# Patient Record
Sex: Female | Born: 1980 | Hispanic: No | Marital: Married | Smoking: Never smoker
Health system: Southern US, Community
[De-identification: ages and names within clinical notes are randomized; demographics above are authoritative.]

## PROBLEM LIST (undated history)

## (undated) DIAGNOSIS — M5127 Other intervertebral disc displacement, lumbosacral region: Secondary | ICD-10-CM

## (undated) DIAGNOSIS — S161XXA Strain of muscle, fascia and tendon at neck level, initial encounter: Secondary | ICD-10-CM

## (undated) HISTORY — DX: Other intervertebral disc displacement, lumbosacral region: M51.27

## (undated) HISTORY — DX: Strain of muscle, fascia and tendon at neck level, initial encounter: S16.1XXA

---

## 2003-09-21 ENCOUNTER — Ambulatory Visit (HOSPITAL_BASED_OUTPATIENT_CLINIC_OR_DEPARTMENT_OTHER): Admission: RE | Admit: 2003-09-21 | Discharge: 2003-09-21 | Payer: Self-pay | Admitting: Plastic Surgery

## 2003-09-21 ENCOUNTER — Ambulatory Visit (HOSPITAL_COMMUNITY): Admission: RE | Admit: 2003-09-21 | Discharge: 2003-09-21 | Payer: Self-pay | Admitting: Plastic Surgery

## 2003-09-21 ENCOUNTER — Encounter (INDEPENDENT_AMBULATORY_CARE_PROVIDER_SITE_OTHER): Payer: Self-pay | Admitting: Specialist

## 2008-11-12 ENCOUNTER — Inpatient Hospital Stay (HOSPITAL_COMMUNITY): Admission: AD | Admit: 2008-11-12 | Discharge: 2008-11-15 | Payer: Self-pay | Admitting: Obstetrics & Gynecology

## 2008-11-13 ENCOUNTER — Encounter (INDEPENDENT_AMBULATORY_CARE_PROVIDER_SITE_OTHER): Payer: Self-pay | Admitting: Obstetrics and Gynecology

## 2010-03-21 ENCOUNTER — Inpatient Hospital Stay (HOSPITAL_COMMUNITY): Admission: RE | Admit: 2010-03-21 | Discharge: 2010-03-24 | Payer: Self-pay | Admitting: Obstetrics and Gynecology

## 2010-12-10 LAB — CBC
HCT: 27.2 % — ABNORMAL LOW (ref 36.0–46.0)
HCT: 31.5 % — ABNORMAL LOW (ref 36.0–46.0)
Hemoglobin: 10.5 g/dL — ABNORMAL LOW (ref 12.0–15.0)
Hemoglobin: 9.3 g/dL — ABNORMAL LOW (ref 12.0–15.0)
MCH: 26.7 pg (ref 26.0–34.0)
MCH: 27.2 pg (ref 26.0–34.0)
MCHC: 33.3 g/dL (ref 30.0–36.0)
MCHC: 34.1 g/dL (ref 30.0–36.0)
MCV: 79.9 fL (ref 78.0–100.0)
MCV: 80.4 fL (ref 78.0–100.0)
Platelets: 184 10*3/uL (ref 150–400)
Platelets: 210 10*3/uL (ref 150–400)
RBC: 3.41 MIL/uL — ABNORMAL LOW (ref 3.87–5.11)
RBC: 3.91 MIL/uL (ref 3.87–5.11)
RDW: 16.2 % — ABNORMAL HIGH (ref 11.5–15.5)
RDW: 16.9 % — ABNORMAL HIGH (ref 11.5–15.5)
WBC: 7.5 10*3/uL (ref 4.0–10.5)
WBC: 8.1 10*3/uL (ref 4.0–10.5)

## 2010-12-10 LAB — SURGICAL PCR SCREEN
MRSA, PCR: NEGATIVE
Staphylococcus aureus: NEGATIVE

## 2010-12-10 LAB — CCBB MATERNAL DONOR DRAW

## 2010-12-10 LAB — RPR: RPR Ser Ql: NONREACTIVE

## 2011-01-09 LAB — RPR: RPR Ser Ql: NONREACTIVE

## 2011-01-09 LAB — CBC
HCT: 25.3 % — ABNORMAL LOW (ref 36.0–46.0)
HCT: 36.5 % (ref 36.0–46.0)
Hemoglobin: 12.1 g/dL (ref 12.0–15.0)
Hemoglobin: 8.4 g/dL — ABNORMAL LOW (ref 12.0–15.0)
MCHC: 33 g/dL (ref 30.0–36.0)
MCHC: 33.1 g/dL (ref 30.0–36.0)
MCV: 85.3 fL (ref 78.0–100.0)
MCV: 86.5 fL (ref 78.0–100.0)
Platelets: 189 10*3/uL (ref 150–400)
Platelets: 275 10*3/uL (ref 150–400)
RBC: 2.92 MIL/uL — ABNORMAL LOW (ref 3.87–5.11)
RBC: 4.28 MIL/uL (ref 3.87–5.11)
RDW: 14.3 % (ref 11.5–15.5)
RDW: 14.4 % (ref 11.5–15.5)
WBC: 11.6 10*3/uL — ABNORMAL HIGH (ref 4.0–10.5)
WBC: 13 10*3/uL — ABNORMAL HIGH (ref 4.0–10.5)

## 2011-02-06 NOTE — Discharge Summary (Signed)
Cindy Miles, BUMP            ACCOUNT NO.:  [id number]   MEDICAL RECORD NO.:  [id number]          PATIENT TYPE:  INP   LOCATION:  9141                          [facility]ACILITY:  [facility]   PHYSICIAN:  Gerrit Friends. Aldona Bar, M.D.   DATE OF BIRTH:  [birth year]   DATE OF ADMISSION:  11/12/2008  DATE OF DISCHARGE:  11/15/2008                               DISCHARGE SUMMARY   DISCHARGE DIAGNOSES:  1. 38-week intrauterine pregnancy, delivered 7 pounds and 7 ounces,      female infant, Apgars of 9 and 10.  2. Blood type is A positive.  3. Nonreassuring fetal heart tracing.   PROCEDURES:  Primary low transverse cesarean section and repair of left  vaginal extension.   SUMMARY:  This is a 30 year old primigravida with a due date of November 29, 2008 presented with ruptured membranes on November 12, 2008.  She was  admitted and her labor progressed although during the second stage,  developed some late decelerations with pushing, and subsequently was  taken to the operating room for primary low transverse cesarean section  for delivery.  Her postoperative course was relatively benign.  On the  morning of discharge, November 15, 2008, she was afebrile, tolerating a  regular diet well, having normal bowel and bladder function and was  breast feeding without difficulty.  Her wound was clean and dry.  Her  uterus was firm.  Her hemoglobin on November 14, 2008 was 8.4 with a  white count of 11,600, and platelet count of 189,000.  On the morning of  November 15, 2008, she expressed desire to be discharged to home and  accordingly after being given all appropriate instructions, was  discharged to home.  She will return to the office on November 17, 2008  for her wound to be checked and her staples removed and wound Steri-  Stripped.  She was given prescriptions for Motrin 600 mg use every 6  hours as needed for cramping or pain and Tylox 1-2 every 4-6 hours as  needed for more severe pain.  She will continue on her  vitamins as long  as she is breast feeding and she will use Feosol capsules - 1 daily for  the next month or so to correct her anemia.   She understood all her instructions well per discharge brochure and  explanation.  As mentioned, she will return the office on November 17, 2008 to have her wound checked.  Her staples removed and will have her  postpartum checkup in 4 weeks hence.   CONDITION ON DISCHARGE:  Improved.      Gerrit Friends. Aldona Bar, M.D.  Electronically Signed     RMW/MEDQ  D:  11/15/2008  T:  11/15/2008  Job:  [id number]

## 2011-02-06 NOTE — Consult Note (Signed)
Cindy Miles, Cindy Miles            ACCOUNT NO.:  [id number]   MEDICAL RECORD NO.:  [id number]          PATIENT TYPE:  INP   LOCATION:  9141                          [facility]ILITY:  [facility]   PHYSICIAN:  Malva Limes, M.D.    DATE OF BIRTH:  1981/03/07   DATE OF CONSULTATION:  DATE OF DISCHARGE:                                 CONSULTATION   PREOPERATIVE DIAGNOSES:  1. Intrauterine pregnancy at term.  2. Cephalopelvic disproportion.   POSTOPERATIVE DIAGNOSES:  1. Intrauterine pregnancy at term.  2. Cephalopelvic disproportion.   PROCEDURE:  Primary low transverse cesarean section with repair of left  vaginal extension.   SURGEON:  Malva Limes, MD   ANESTHESIA:  Epidural.   ANTIBIOTICS:  Ancef 1 g.   DRAINS:  Foley bedside drainage.   ESTIMATED BLOOD LOSS:  900 mL.   SPECIMENS:  Placenta sent to Pathology.   COMPLICATIONS:  None.   FINDINGS:  The patient delivered 1 live viable white female infant,  weighing 7 pounds 7 ounces.  The Apgars were 9 at 1 minute and 10 at 5  minutes.  The patient had normal fallopian tubes and ovaries  bilaterally.   PROCEDURE:  The patient was taken to the operating room where she was  placed in dorsal supine position with a left lateral tilt.  Once an  adequate level was reached, she was prepped and draped in the usual for  this procedure.  Pfannenstiel skin incision was then made 2-cm above the  pubic symphysis.  This was carried down to the fascia.  The fascia was  entered in the midline and extended laterally with Mayo scissors.  Rectus muscles were then dissected from the fascia with Bovey.  The  rectus muscle was divided in the midline and taken superiorly and  inferiorly.  Bladder peritoneum was entered sharply.  The bladder flap  was taken down sharply.  A low-transverse uterine incision was made in  midline extended laterally with blunt dissection.  The infant was  delivered in the vertex presentation and delivered the head.  The  oropharynx and nostrils were bulb suctioned.  The cord will be clamped  and cut and the infant handed to the awaiting NICU team.  Placenta was  manually removed.  The uterus was exteriorized.  The uterine cavity was  cleaned with wet laps and inspected.  The uterine incision was closed in  a single layer of 0-monocryl running locking fashion.  There was an  extension inferior on the left which was also closed with 0-Monocryl in  running locking fashion.  The bladder flap was then closed using 2-0  Monocryl in running fashion.  Hemostasis was checked and found to be  adequate.  Uterus is placed back in abdominal cavity.  Again, hemostasis  was checked and found to be adequate.  Bladder peritoneum and rectus  muscles reapproximaetd in the midline using 2-0 Monocryl in running  fashion.  Fascia was closed using 0-Monocryl suture in running fashion.  The subcutaneous tissue is made hemostatic with Bovey.  Subcuticular  tissue was closed with interrupted 2-0 plain gut suture.  The skin was  closed with stainless steel clips.  The patient tolerated the procedure  well.  She was taken to the recovery room in stable condition.  Instruments and needle count correct x2.           ______________________________  Malva Limes, M.D.     MA/MEDQ  D:  11/13/2008  T:  11/13/2008  Job:  [id number]

## 2013-07-22 ENCOUNTER — Encounter: Payer: Self-pay | Admitting: Physician Assistant

## 2013-07-22 ENCOUNTER — Ambulatory Visit (INDEPENDENT_AMBULATORY_CARE_PROVIDER_SITE_OTHER): Payer: BC Managed Care – PPO | Admitting: Physician Assistant

## 2013-07-22 VITALS — BP 102/60 | HR 76 | Temp 98.9°F | Resp 18 | Ht 62.5 in | Wt 125.0 lb

## 2013-07-22 DIAGNOSIS — N390 Urinary tract infection, site not specified: Secondary | ICD-10-CM

## 2013-07-22 DIAGNOSIS — R3915 Urgency of urination: Secondary | ICD-10-CM

## 2013-07-22 LAB — URINALYSIS, ROUTINE W REFLEX MICROSCOPIC

## 2013-07-22 LAB — URINALYSIS, MICROSCOPIC ONLY
Casts: NONE SEEN
Crystals: NONE SEEN

## 2013-07-22 MED ORDER — NITROFURANTOIN MONOHYD MACRO 100 MG PO CAPS: 100.0000 mg | ORAL_CAPSULE | Freq: Two times a day (BID) | ORAL | Status: DC

## 2013-07-22 NOTE — Progress Notes (Signed)
Patient ID: Cindy Miles MRN: [id number], DOB: [birth year], 32 y.o. Date of Encounter: 07/22/2013, 3:10 PM    Chief Complaint:  Chief Complaint  Patient presents with  . c/o poss UTI    burning, urgency, pelvic pain  was treatedby nurse at work (who told her to come her)  she gave her one day dose of Bactrim     HPI: 32 y.o. year old white female reports the symptoms started all of a sudden yesterday while at work. She was having some diffuse achy discomfort in her lower abdomen radiating to her lower back. She also developed dysuria and urgency and frequency. 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 They were able to do a urinalysis. Patient was told that it definitely was consistent with infection. They were able to give her one day worth of antibiotics to get her started on treatment with plans for her to followup here. She states she took one day of Bactrim treatment. She's had no fevers or chills.     Home Meds: See attached medication section for any medications that were entered at today's visit. The computer does not put those onto this list.The following list is a list of meds entered prior to today's visit.   No current outpatient prescriptions on file prior to visit.   No current facility-administered medications on file prior to visit.    Allergies: No Known Allergies    Review of Systems: See HPI for pertinent ROS. All other ROS negative.    Physical Exam: Blood pressure 102/60, pulse 76, temperature 98.9 F (37.2 C), temperature source Oral, resp. rate 18, height 5' 2.5" (1.588 m), weight 125 lb (56.7 kg)., Body mass index is 22.48 kg/(m^2). General: WNWD WF.  Appears in no acute distress. Lungs: Clear bilaterally to auscultation without wheezes, rales, or rhonchi. Breathing is unlabored. Heart: Regular rhythm. No murmurs, rubs, or gallops. Abdomen: Soft,  non-distended with normoactive bowel sounds. No hepatomegaly. No rebound/guarding. No  obvious abdominal masses. Mild tenderness with palpation of the suprapubic area. No other areas of tenderness. Msk:  Strength and tone normal for age. No tenderness with percussion of costophrenic angles bilaterally. Extremities/Skin: Warm and dry. No clubbing or cyanosis. No edema. No rashes or suspicious lesions. Neuro: Alert and oriented X 3. Moves all extremities spontaneously. Gait is normal. CNII-XII grossly in tact. Psych:  Responds to questions appropriately with a normal affect.   Results for orders placed in visit on 07/22/13  URINALYSIS, ROUTINE W REFLEX MICROSCOPIC      Result Value Range   Color, Urine ORANGE (*) YELLOW   APPearance CLOUDY (*) CLEAR   Specific Gravity, Urine TEST NOT PERFORMED  1.005 - 1.030   pH TEST NOT PERFORMED  5.0 - 8.0   Glucose, UA TEST NOT PERFORMED  NEG mg/dL   Bilirubin Urine TEST NOT PERFORMED  NEG   Ketones, ur TEST NOT PERFORMED  NEG mg/dL   Hgb urine dipstick TEST NOT PERFORMED  NEG   Protein, ur TEST NOT PERFORMED  NEG mg/dL   Urobilinogen, UA TEST NOT PERFORMED  0.0 - 1.0 mg/dL   Nitrite TEST NOT PERFORMED  NEG   Leukocytes, UA TEST NOT PERFORMED  NEG  URINALYSIS, MICROSCOPIC ONLY      Result Value Range   Squamous Epithelial / LPF FEW  RARE   Crystals NONE SEEN  NONE SEEN   Casts NONE SEEN  NONE SEEN   WBC, UA 7-10 (*) <3 WBC/hpf  RBC / HPF 0-2  <3 RBC/hpf   Bacteria, UA MANY (*) RARE   Urine-Other MUCUS PRESENT       ASSESSMENT AND PLAN:  32 y.o. year old female with  1. UTI (urinary tract infection) - nitrofurantoin, macrocrystal-monohydrate, (MACROBID) 100 MG capsule; Take 1 capsule (100 mg total) by mouth 2 (two) times daily.  Dispense: 6 capsule; Refill: 0  2. Urgency of urination - Urinalysis, Routine w reflex microscopic  Complete all the antibiotic. Follow up if symptoms do not resolve or if develops any fever.  Signed, [address] [place], [place], [facility] 07/22/2013 3:10 PM

## 2014-08-10 ENCOUNTER — Emergency Department (HOSPITAL_COMMUNITY): Payer: BC Managed Care – PPO

## 2014-08-10 ENCOUNTER — Encounter (HOSPITAL_COMMUNITY): Payer: Self-pay | Admitting: *Deleted

## 2014-08-10 ENCOUNTER — Emergency Department (HOSPITAL_COMMUNITY)
Admission: EM | Admit: 2014-08-10 | Discharge: 2014-08-10 | Disposition: A | Discharge status: Home / Self Care | Payer: BC Managed Care – PPO | Attending: Emergency Medicine | Admitting: Emergency Medicine

## 2014-08-10 DIAGNOSIS — Y9389 Activity, other specified: Secondary | ICD-10-CM | POA: Insufficient documentation

## 2014-08-10 DIAGNOSIS — Y92410 Unspecified street and highway as the place of occurrence of the external cause: Secondary | ICD-10-CM | POA: Insufficient documentation

## 2014-08-10 DIAGNOSIS — S3992XA Unspecified injury of lower back, initial encounter: Secondary | ICD-10-CM | POA: Diagnosis not present

## 2014-08-10 DIAGNOSIS — Z79899 Other long term (current) drug therapy: Secondary | ICD-10-CM | POA: Diagnosis not present

## 2014-08-10 DIAGNOSIS — M542 Cervicalgia: Secondary | ICD-10-CM

## 2014-08-10 DIAGNOSIS — Y990 Civilian activity done for income or pay: Secondary | ICD-10-CM | POA: Diagnosis not present

## 2014-08-10 DIAGNOSIS — S199XXA Unspecified injury of neck, initial encounter: Secondary | ICD-10-CM | POA: Insufficient documentation

## 2014-08-10 DIAGNOSIS — M549 Dorsalgia, unspecified: Secondary | ICD-10-CM

## 2014-08-10 DIAGNOSIS — Z791 Long term (current) use of non-steroidal anti-inflammatories (NSAID): Secondary | ICD-10-CM | POA: Insufficient documentation

## 2014-08-10 DIAGNOSIS — M545 Low back pain, unspecified: Secondary | ICD-10-CM

## 2014-08-10 DIAGNOSIS — Z3202 Encounter for pregnancy test, result negative: Secondary | ICD-10-CM | POA: Insufficient documentation

## 2014-08-10 LAB — POC URINE PREG, ED: Preg Test, Ur: NEGATIVE

## 2014-08-10 MED ORDER — MELOXICAM 7.5 MG PO TABS: 7.5000 mg | ORAL_TABLET | Freq: Every day | ORAL | Status: DC

## 2014-08-10 MED ORDER — DIAZEPAM 5 MG PO TABS
5.0000 mg | ORAL_TABLET | Freq: Once | ORAL | Status: AC
  Administered 2014-08-10: 5 mg via ORAL
  Filled 2014-08-10: qty 1

## 2014-08-10 MED ORDER — METHOCARBAMOL 500 MG PO TABS: 500.0000 mg | ORAL_TABLET | Freq: Two times a day (BID) | ORAL | Status: DC

## 2014-08-10 MED ORDER — HYDROCODONE-ACETAMINOPHEN 5-325 MG PO TABS
1.0000 | ORAL_TABLET | ORAL | Status: DC | PRN
Start: 2014-08-10 — End: 2014-08-26

## 2014-08-10 MED ORDER — HYDROCODONE-ACETAMINOPHEN 5-325 MG PO TABS
2.0000 | ORAL_TABLET | Freq: Once | ORAL | Status: AC
  Administered 2014-08-10: 2 via ORAL
  Filled 2014-08-10: qty 2

## 2014-08-10 NOTE — ED Notes (Signed)
Patient transported to radiology Patient in NAD upon leaving unit for testing

## 2014-08-10 NOTE — ED Notes (Signed)
EDP at bedside  

## 2014-08-10 NOTE — ED Notes (Signed)
Patient medicated for pain prior to testing, see MAR

## 2014-08-10 NOTE — ED Notes (Signed)
Patient back from radiology Patient remains in NAD 
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
## 2014-08-10 NOTE — ED Notes (Signed)
Patient ambulatory to bathroom without difficulty or assistance
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
## 2014-08-16 ENCOUNTER — Ambulatory Visit (INDEPENDENT_AMBULATORY_CARE_PROVIDER_SITE_OTHER): Payer: BC Managed Care – PPO | Admitting: Physician Assistant

## 2014-08-16 ENCOUNTER — Encounter: Payer: Self-pay | Admitting: Family Medicine

## 2014-08-16 ENCOUNTER — Encounter: Payer: Self-pay | Admitting: Physician Assistant

## 2014-08-16 DIAGNOSIS — M549 Dorsalgia, unspecified: Secondary | ICD-10-CM

## 2014-08-16 DIAGNOSIS — M542 Cervicalgia: Secondary | ICD-10-CM

## 2014-08-16 MED ORDER — HYDROCODONE-ACETAMINOPHEN 7.5-325 MG PO TABS: 1.0000 | ORAL_TABLET | Freq: Four times a day (QID) | ORAL | Status: DC | PRN
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
## 2014-08-26 ENCOUNTER — Telehealth: Payer: Self-pay | Admitting: *Deleted

## 2014-08-26 DIAGNOSIS — M549 Dorsalgia, unspecified: Secondary | ICD-10-CM

## 2014-08-26 DIAGNOSIS — M542 Cervicalgia: Secondary | ICD-10-CM

## 2014-08-26 MED ORDER — HYDROCODONE-ACETAMINOPHEN 7.5-325 MG PO TABS: 1.0000 | ORAL_TABLET | Freq: Four times a day (QID) | ORAL | Status: DC | PRN

## 2014-08-26 MED ORDER — MELOXICAM 7.5 MG PO TABS: 7.5000 mg | ORAL_TABLET | Freq: Every day | ORAL | Status: DC

## 2014-08-26 MED ORDER — METHOCARBAMOL 500 MG PO TABS: 500.0000 mg | ORAL_TABLET | Freq: Two times a day (BID) | ORAL | Status: DC

## 2014-08-26 NOTE — Telephone Encounter (Signed)
Prescription printed and patient made aware to come to office to pick up.   Prescriptions sent to pharmacy.

## 2014-08-26 NOTE — Telephone Encounter (Signed)
Ok to refill all of these medications--with 0 zero additional refills.
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
## 2014-09-03 ENCOUNTER — Ambulatory Visit (INDEPENDENT_AMBULATORY_CARE_PROVIDER_SITE_OTHER): Payer: BC Managed Care – PPO | Admitting: Family Medicine

## 2014-09-03 ENCOUNTER — Encounter: Payer: Self-pay | Admitting: Family Medicine

## 2014-09-03 DIAGNOSIS — M542 Cervicalgia: Secondary | ICD-10-CM

## 2014-09-03 MED ORDER — PREDNISONE 20 MG PO TABS: ORAL_TABLET | ORAL | Status: DC

## 2014-09-03 MED ORDER — HYDROCODONE-ACETAMINOPHEN 7.5-325 MG PO TABS: 1.0000 | ORAL_TABLET | Freq: Four times a day (QID) | ORAL | Status: DC | PRN

## 2014-09-03 MED ORDER — CARISOPRODOL 350 MG PO TABS: 350.0000 mg | ORAL_TABLET | Freq: Four times a day (QID) | ORAL | Status: DC | PRN
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
## 2014-09-09 ENCOUNTER — Telehealth: Payer: Self-pay | Admitting: Physical Therapy

## 2014-09-09 ENCOUNTER — Ambulatory Visit: Payer: BC Managed Care – PPO | Attending: Family Medicine | Admitting: Physical Therapy

## 2014-09-09 DIAGNOSIS — M542 Cervicalgia: Secondary | ICD-10-CM | POA: Insufficient documentation
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
## 2014-09-09 NOTE — Telephone Encounter (Signed)
Pt stated that she wants to give us a call back to schedule her appts once she speaks with her family.

## 2014-09-09 NOTE — Patient Instructions (Signed)
Extensors, Supine   Lie supine, head on small, rolled towel. Gently tuck chin and bring toward chest. Hold __2_ seconds. Repeat __8_ times per session. Do 3___ sessions per day.  Copyright  VHI. All rights reserved.  Flexibility: Neck Retraction   Pull head straight back, keeping eyes and jaw level. Repeat ___8_ times per set. Do __2__ sets per session. Do ___3_ sessions per day.  http://orth.exer.us/344   Copyright  VHI. All rights reserved.  Posture Tips DO: - stand tall and erect - keep chin tucked in - keep head and shoulders in alignment - check posture regularly in mirror or large window - pull head back against headrest in car seat;  Change your position often.  Sit with lumbar support. DON'T: - slouch or slump while watching TV or reading - sit, stand or lie in one position  for too long;  Sitting is especially hard on the spine so if you sit at a desk/use the computer, then stand up often!   Copyright  VHI. All rights reserved.  Posture - Standing   Good posture is important. Avoid slouching and forward head thrust. Maintain curve in low back and align ears over shoul- ders, hips over ankles.  Pull your belly button in toward your back bone.   Copyright  VHI. All rights reserved.  Posture - Sitting   Sit upright, head facing forward. Try using a roll to support lower back. Keep shoulders relaxed, and avoid rounded back. Keep hips level with knees. Avoid crossing legs for long periods.   Copyright  VHI. All rights reserved.

## 2014-09-13 ENCOUNTER — Ambulatory Visit: Payer: BC Managed Care – PPO | Admitting: Physical Therapy

## 2014-09-14 ENCOUNTER — Ambulatory Visit: Payer: BC Managed Care – PPO | Admitting: Physical Therapy

## 2014-09-14 DIAGNOSIS — M542 Cervicalgia: Secondary | ICD-10-CM | POA: Diagnosis not present

## 2014-09-14 NOTE — Therapy (Signed)
[place], [place], [address] Phone: [phone]   Fax:  [phone]  Physical Therapy Treatment  Patient Details  Name: Cindy Miles MRN: [id number] Date of Birth: [birth year]  Encounter Date: 09/14/2014      PT End of Session - 09/14/14 0959    Visit Number 2   Number of Visits 16   Date for PT Re-Evaluation 11/04/14   PT Start Time 0734   PT Stop Time 0815   PT Time Calculation (min) 41 min   Activity Tolerance Patient tolerated treatment well;Patient limited by pain      No past medical history on file.  No past surgical history on file.  There were no vitals taken for this visit.  Visit Diagnosis:  Neck pain      Subjective Assessment - 09/14/14 0809    Symptoms Feels the same.  Has been doing the neck Range stretches. Has a good chair at work still at the end of he day has 8/10 pain and a migraine type Head ach.                    [facility] Adult PT Treatment/Exercise - 09/14/14 0740    Moist Heat Therapy   Number Minutes Moist Heat 15 Minutes   Moist Heat Location --  back, neck   Electrical Stimulation   Electrical Stimulation Location --  Lt upper trap, neck, scapula   Electrical Stimulation Action IFC   Electrical Stimulation Parameters 8   Electrical Stimulation Goals Pain   Manual Therapy   Manual Therapy --  soft tissue work to upper back, neck, prone. did not change                   PT Short Term Goals - 09/14/14 1004    PT SHORT TERM GOAL #1   Title "Independent with initial HEP   Time 4   Period Weeks   Status Achieved   PT SHORT TERM GOAL #2   Title Cervical sidebending and right rotation  improved to 32 degrees needed for driving, home and work activities.   Time 4   Period Weeks   Status Unable to assess   PT SHORT TERM GOAL #3   Title "Demonstrate understanding of proper sitting posture, body mechanics, work ergonomics, and be more  conscious of position and posture throughout the day   Time 4   Period Weeks   Status On-going           PT Long Term Goals - 09/09/14 1148    PT LONG TERM GOAL #1   Title "Pt will be independent with advanced HEP   Time 8   Period Weeks   Status New   PT LONG TERM GOAL #2   Title Left shoulder flexion and abduction improved to 160 degrees needed for reaching into upper cabinets at home.   Time 8   Period Weeks   Status New   PT LONG TERM GOAL #3   Title "Pt will tolerate working on computer with modifications and breaks as needed with pain 3/10 on average.   Time 8   Period Weeks   Status New   PT LONG TERM GOAL #4   Title "FOTO will improve from  62%  to  36%   indicating improved functional mobility with less pain.   Time 8   Period Weeks   Status New  Plan - 09/14/14 1000    Clinical Impression Statement Pain continues with soft tissue work, helped with modalities.  Adherent with home exercises and tries to use good posture.  No new pain goals met.   PT Next Visit Plan Measure neck, shoulder range Lt. gentle stretches.        Problem List There are no active problems to display for this patient.  Melvenia Needles, PTA 09/14/2014 10:05 AM Phone: [phone] Fax: [phone]  [facility] 09/14/2014, 10:05 AM  [place], [place], [address] Phone: [phone]   Fax:  [phone]

## 2014-09-20 ENCOUNTER — Ambulatory Visit: Payer: BC Managed Care – PPO

## 2014-09-20 DIAGNOSIS — M542 Cervicalgia: Secondary | ICD-10-CM | POA: Diagnosis not present
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
## 2014-09-20 NOTE — Patient Instructions (Signed)
Encouraged pt to gently increase stretch into rotation and side bending  To improve range as it is not improveing

## 2014-09-23 ENCOUNTER — Ambulatory Visit: Payer: BC Managed Care – PPO | Admitting: Rehabilitation

## 2014-09-23 DIAGNOSIS — M542 Cervicalgia: Secondary | ICD-10-CM | POA: Diagnosis not present

## 2014-09-23 NOTE — Patient Instructions (Signed)
Levator Stretch   Grasp seat or sit on hand on side to be stretched. Turn head toward other side and look down. Use hand on head to gently stretch neck in that position. Hold _20-30___ seconds. Repeat on other side. Repeat _3___ times. Do _2___ sessions per day.  http://gt2.exer.us/30   Copyright  VHI. All rights reserved.  Side-Bending   One hand on opposite side of head, pull head to side as far as is comfortable. Stop if there is pain. Hold __30__ seconds. Repeat with other hand to other side. Repeat __3__ times. Do __2__ sessions per day.   Copyright  VHI. All rights reserved.  Scapular Retraction (Standing)   With arms at sides, pinch shoulder blades together. Hold 5 seconds Repeat ___10_ times per set. Do __1-2_ sets per session. Do _2_ sessions per day.  http://orth.exer.us/944   Copyright  VHI. All rights reserved.  Chin Protraction / Retraction   Slide head forward keeping chin level. Slide head back, pulling chin in. Hold each position 5___ seconds. Repeat _10__ times. Do _2__ sessions per day.  Copyright  VHI. All rights reserved.
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
## 2014-09-27 ENCOUNTER — Ambulatory Visit: Payer: BLUE CROSS/BLUE SHIELD | Attending: Family Medicine | Admitting: Physical Therapy

## 2014-09-27 DIAGNOSIS — M542 Cervicalgia: Secondary | ICD-10-CM | POA: Diagnosis not present
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
## 2014-09-27 NOTE — Patient Instructions (Signed)
Discussed the pain cycle.

## 2014-09-30 ENCOUNTER — Encounter: Payer: BC Managed Care – PPO | Admitting: Physical Therapy

## 2014-10-01 ENCOUNTER — Ambulatory Visit: Payer: BLUE CROSS/BLUE SHIELD

## 2014-10-01 ENCOUNTER — Ambulatory Visit: Payer: BC Managed Care – PPO | Admitting: Family Medicine

## 2014-10-01 DIAGNOSIS — M542 Cervicalgia: Secondary | ICD-10-CM | POA: Diagnosis not present
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
## 2014-10-04 ENCOUNTER — Ambulatory Visit: Payer: BLUE CROSS/BLUE SHIELD

## 2014-10-04 ENCOUNTER — Encounter: Payer: Self-pay | Admitting: Family Medicine

## 2014-10-04 ENCOUNTER — Ambulatory Visit (INDEPENDENT_AMBULATORY_CARE_PROVIDER_SITE_OTHER): Payer: BLUE CROSS/BLUE SHIELD | Admitting: Family Medicine

## 2014-10-04 DIAGNOSIS — M542 Cervicalgia: Secondary | ICD-10-CM | POA: Diagnosis not present

## 2014-10-04 MED ORDER — HYDROCODONE-ACETAMINOPHEN 7.5-325 MG PO TABS: 1.0000 | ORAL_TABLET | Freq: Four times a day (QID) | ORAL | Status: DC | PRN
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
## 2014-10-04 NOTE — Patient Instructions (Signed)
She was instructed and handout issued for supine Level 2 cervical stabilization exercises, 10-20 reps , 1x/day at home.

## 2014-10-05 ENCOUNTER — Telehealth: Payer: Self-pay | Admitting: Family Medicine

## 2014-10-05 NOTE — Telephone Encounter (Signed)
[phone] Patient is calling to ask since dr pickard is referring her to pain clinic, does she need to stop going to pt?

## 2014-10-05 NOTE — Telephone Encounter (Signed)
I'd continue pt until after she sees the [facility].

## 2014-10-05 NOTE — Telephone Encounter (Signed)
Pt aware.

## 2014-10-07 ENCOUNTER — Ambulatory Visit: Payer: BLUE CROSS/BLUE SHIELD

## 2014-10-07 ENCOUNTER — Ambulatory Visit: Payer: BC Managed Care – PPO | Admitting: Family Medicine

## 2014-10-12 ENCOUNTER — Ambulatory Visit: Payer: BLUE CROSS/BLUE SHIELD | Admitting: Rehabilitation

## 2014-10-12 DIAGNOSIS — M542 Cervicalgia: Secondary | ICD-10-CM
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
## 2014-11-05 ENCOUNTER — Encounter: Payer: Self-pay | Admitting: Neurology

## 2014-11-05 ENCOUNTER — Ambulatory Visit (INDEPENDENT_AMBULATORY_CARE_PROVIDER_SITE_OTHER): Payer: BLUE CROSS/BLUE SHIELD | Admitting: Neurology

## 2014-11-05 VITALS — BP 102/66 | HR 63 | Ht 64.0 in | Wt 144.4 lb

## 2014-11-05 DIAGNOSIS — S161XXA Strain of muscle, fascia and tendon at neck level, initial encounter: Secondary | ICD-10-CM

## 2014-11-05 HISTORY — DX: Strain of muscle, fascia and tendon at neck level, initial encounter: S16.1XXA

## 2014-11-05 MED ORDER — DICLOFENAC POTASSIUM 50 MG PO TABS: 50.0000 mg | ORAL_TABLET | Freq: Two times a day (BID) | ORAL | Status: DC

## 2014-11-05 MED ORDER — NORTRIPTYLINE HCL 10 MG PO CAPS: ORAL_CAPSULE | ORAL | Status: DC
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
## 2014-11-05 NOTE — Patient Instructions (Signed)
Pamelor (nortriptyline) is an antidepressant medication that has many uses that may include headache, whiplash injuries, or for peripheral neuropathy pain. Side effects may include drowsiness, dry mouth, blurred vision, or constipation. As with any antidepressant medication, worsening depression may occur. If you had any significant side effects, please call our office. The full effects of this medication may take 7-10 days after starting the drug, or going up on the dose.  Cervical Strain and Sprain (Whiplash) with Rehab Cervical strain and sprain are injuries that commonly occur with "whiplash" injuries. Whiplash occurs when the neck is forcefully whipped backward or forward, such as during a motor vehicle accident or during contact sports. The muscles, ligaments, tendons, discs, and nerves of the neck are susceptible to injury when this occurs. RISK FACTORS Risk of having a whiplash injury increases if:  Osteoarthritis of the spine.  Situations that make head or neck accidents or trauma more likely.  High-risk sports (football, rugby, wrestling, hockey, auto racing, gymnastics, diving, contact karate, or boxing).  Poor strength and flexibility of the neck.  Previous neck injury.  Poor tackling technique.  Improperly fitted or padded equipment. SYMPTOMS   Pain or stiffness in the front or back of neck or both.  Symptoms may present immediately or up to 24 hours after injury.  Dizziness, headache, nausea, and vomiting.  Muscle spasm with soreness and stiffness in the neck.  Tenderness and swelling at the injury site. PREVENTION  Learn and use proper technique (avoid tackling with the head, spearing, and head-butting; use proper falling techniques to avoid landing on the head).  Warm up and stretch properly before activity.  Maintain physical fitness:  Strength, flexibility, and endurance.  Cardiovascular fitness.  Wear properly fitted and padded protective equipment,  such as padded soft collars, for participation in contact sports. PROGNOSIS  Recovery from cervical strain and sprain injuries is dependent on the extent of the injury. These injuries are usually curable in 1 week to 3 months with appropriate treatment.  RELATED COMPLICATIONS   Temporary numbness and weakness may occur if the nerve roots are damaged, and this may persist until the nerve has completely healed.  Chronic pain due to frequent recurrence of symptoms.  Prolonged healing, especially if activity is resumed too soon (before complete recovery). TREATMENT  Treatment initially involves the use of ice and medication to help reduce pain and inflammation. It is also important to perform strengthening and stretching exercises and modify activities that worsen symptoms so the injury does not get worse. These exercises may be performed at home or with a therapist. For patients who experience severe symptoms, a soft, padded collar may be recommended to be worn around the neck.  Improving your posture may help reduce symptoms. Posture improvement includes pulling your chin and abdomen in while sitting or standing. If you are sitting, sit in a firm chair with your buttocks against the back of the chair. While sleeping, try replacing your pillow with a small towel rolled to 2 inches in diameter, or use a cervical pillow or soft cervical collar. Poor sleeping positions delay healing.  For patients with nerve root damage, which causes numbness or weakness, the use of a cervical traction apparatus may be recommended. Surgery is rarely necessary for these injuries. However, cervical strain and sprains that are present at birth (congenital) may require surgery. MEDICATION   If pain medication is necessary, nonsteroidal anti-inflammatory medications, such as aspirin and ibuprofen, or other minor pain relievers, such as acetaminophen, are often recommended.  Do not take pain medication for 7 days before  surgery.  Prescription pain relievers may be given if deemed necessary by your caregiver. Use only as directed and only as much as you need. HEAT AND COLD:   Cold treatment (icing) relieves pain and reduces inflammation. Cold treatment should be applied for 10 to 15 minutes every 2 to 3 hours for inflammation and pain and immediately after any activity that aggravates your symptoms. Use ice packs or an ice massage.  Heat treatment may be used prior to performing the stretching and strengthening activities prescribed by your caregiver, physical therapist, or athletic trainer. Use a heat pack or a warm soak. SEEK MEDICAL CARE IF:   Symptoms get worse or do not improve in 2 weeks despite treatment.  New, unexplained symptoms develop (drugs used in treatment may produce side effects). EXERCISES RANGE OF MOTION (ROM) AND STRETCHING EXERCISES - Cervical Strain and Sprain These exercises may help you when beginning to rehabilitate your injury. In order to successfully resolve your symptoms, you must improve your posture. These exercises are designed to help reduce the forward-head and rounded-shoulder posture which contributes to this condition. Your symptoms may resolve with or without further involvement from your physician, physical therapist or athletic trainer. While completing these exercises, remember:   Restoring tissue flexibility helps normal motion to return to the joints. This allows healthier, less painful movement and activity.  An effective stretch should be held for at least 20 seconds, although you may need to begin with shorter hold times for comfort.  A stretch should never be painful. You should only feel a gentle lengthening or release in the stretched tissue. STRETCH- Axial Extensors  Lie on your back on the floor. You may bend your knees for comfort. Place a rolled-up hand towel or dish towel, about 2 inches in diameter, under the part of your head that makes contact with the  floor.  Gently tuck your chin, as if trying to make a "double chin," until you feel a gentle stretch at the base of your head.  Hold __________ seconds. Repeat __________ times. Complete this exercise __________ times per day.  STRETCH - Axial Extension   Stand or sit on a firm surface. Assume a good posture: chest up, shoulders drawn back, abdominal muscles slightly tense, knees unlocked (if standing) and feet hip width apart.  Slowly retract your chin so your head slides back and your chin slightly lowers. Continue to look straight ahead.  You should feel a gentle stretch in the back of your head. Be certain not to feel an aggressive stretch since this can cause headaches later.  Hold for __________ seconds. Repeat __________ times. Complete this exercise __________ times per day. STRETCH - Cervical Side Bend   Stand or sit on a firm surface. Assume a good posture: chest up, shoulders drawn back, abdominal muscles slightly tense, knees unlocked (if standing) and feet hip width apart.  Without letting your nose or shoulders move, slowly tip your right / left ear to your shoulder until your feel a gentle stretch in the muscles on the opposite side of your neck.  Hold __________ seconds. Repeat __________ times. Complete this exercise __________ times per day. STRETCH - Cervical Rotators   Stand or sit on a firm surface. Assume a good posture: chest up, shoulders drawn back, abdominal muscles slightly tense, knees unlocked (if standing) and feet hip width apart.  Keeping your eyes level with the ground, slowly turn your head until you feel  a gentle stretch along the back and opposite side of your neck.  Hold __________ seconds. Repeat __________ times. Complete this exercise __________ times per day. RANGE OF MOTION - Neck Circles   Stand or sit on a firm surface. Assume a good posture: chest up, shoulders drawn back, abdominal muscles slightly tense, knees unlocked (if standing) and  feet hip width apart.  Gently roll your head down and around from the back of one shoulder to the back of the other. The motion should never be forced or painful.  Repeat the motion 10-20 times, or until you feel the neck muscles relax and loosen. Repeat __________ times. Complete the exercise __________ times per day. STRENGTHENING EXERCISES - Cervical Strain and Sprain These exercises may help you when beginning to rehabilitate your injury. They may resolve your symptoms with or without further involvement from your physician, physical therapist, or athletic trainer. While completing these exercises, remember:   Muscles can gain both the endurance and the strength needed for everyday activities through controlled exercises.  Complete these exercises as instructed by your physician, physical therapist, or athletic trainer. Progress the resistance and repetitions only as guided.  You may experience muscle soreness or fatigue, but the pain or discomfort you are trying to eliminate should never worsen during these exercises. If this pain does worsen, stop and make certain you are following the directions exactly. If the pain is still present after adjustments, discontinue the exercise until you can discuss the trouble with your clinician. STRENGTH - Cervical Flexors, Isometric  Face a wall, standing about 6 inches away. Place a small pillow, a ball about 6-8 inches in diameter, or a folded towel between your forehead and the wall.  Slightly tuck your chin and gently push your forehead into the soft object. Push only with mild to moderate intensity, building up tension gradually. Keep your jaw and forehead relaxed.  Hold 10 to 20 seconds. Keep your breathing relaxed.  Release the tension slowly. Relax your neck muscles completely before you start the next repetition. Repeat __________ times. Complete this exercise __________ times per day. STRENGTH- Cervical Lateral Flexors, Isometric   Stand  about 6 inches away from a wall. Place a small pillow, a ball about 6-8 inches in diameter, or a folded towel between the side of your head and the wall.  Slightly tuck your chin and gently tilt your head into the soft object. Push only with mild to moderate intensity, building up tension gradually. Keep your jaw and forehead relaxed.  Hold 10 to 20 seconds. Keep your breathing relaxed.  Release the tension slowly. Relax your neck muscles completely before you start the next repetition. Repeat __________ times. Complete this exercise __________ times per day. STRENGTH - Cervical Extensors, Isometric   Stand about 6 inches away from a wall. Place a small pillow, a ball about 6-8 inches in diameter, or a folded towel between the back of your head and the wall.  Slightly tuck your chin and gently tilt your head back into the soft object. Push only with mild to moderate intensity, building up tension gradually. Keep your jaw and forehead relaxed.  Hold 10 to 20 seconds. Keep your breathing relaxed.  Release the tension slowly. Relax your neck muscles completely before you start the next repetition. Repeat __________ times. Complete this exercise __________ times per day. POSTURE AND BODY MECHANICS CONSIDERATIONS - Cervical Strain and Sprain Keeping correct posture when sitting, standing or completing your activities will reduce the stress put on  different body tissues, allowing injured tissues a chance to heal and limiting painful experiences. The following are general guidelines for improved posture. Your physician or physical therapist will provide you with any instructions specific to your needs. While reading these guidelines, remember:  The exercises prescribed by your provider will help you have the flexibility and strength to maintain correct postures.  The correct posture provides the optimal environment for your joints to work. All of your joints have less wear and tear when properly  supported by a spine with good posture. This means you will experience a healthier, less painful body.  Correct posture must be practiced with all of your activities, especially prolonged sitting and standing. Correct posture is as important when doing repetitive low-stress activities (typing) as it is when doing a single heavy-load activity (lifting). PROLONGED STANDING WHILE SLIGHTLY LEANING FORWARD When completing a task that requires you to lean forward while standing in one place for a long time, place either foot up on a stationary 2- to 4-inch high object to help maintain the best posture. When both feet are on the ground, the low back tends to lose its slight inward curve. If this curve flattens (or becomes too large), then the back and your other joints will experience too much stress, fatigue more quickly, and can cause pain.  RESTING POSITIONS Consider which positions are most painful for you when choosing a resting position. If you have pain with flexion-based activities (sitting, bending, stooping, squatting), choose a position that allows you to rest in a less flexed posture. You would want to avoid curling into a fetal position on your side. If your pain worsens with extension-based activities (prolonged standing, working overhead), avoid resting in an extended position such as sleeping on your stomach. Most people will find more comfort when they rest with their spine in a more neutral position, neither too rounded nor too arched. Lying on a non-sagging bed on your side with a pillow between your knees, or on your back with a pillow under your knees will often provide some relief. Keep in mind, being in any one position for a prolonged period of time, no matter how correct your posture, can still lead to stiffness. WALKING Walk with an upright posture. Your ears, shoulders, and hips should all line up. OFFICE WORK When working at a desk, create an environment that supports good, upright  posture. Without extra support, muscles fatigue and lead to excessive strain on joints and other tissues. CHAIR:  A chair should be able to slide under your desk when your back makes contact with the back of the chair. This allows you to work closely.  The chair's height should allow your eyes to be level with the upper part of your monitor and your hands to be slightly lower than your elbows.  Body position:  Your feet should make contact with the floor. If this is not possible, use a foot rest.  Keep your ears over your shoulders. This will reduce stress on your neck and low back. Document Released: 09/10/2005 Document Revised: 01/25/2014 Document Reviewed: 12/23/2008 [facility] Patient Information 2015 [employer], [place]. This information is not intended to replace advice given to you by your health care provider. Make sure you discuss any questions you have with your health care provider.

## 2014-11-08 ENCOUNTER — Telehealth: Payer: Self-pay | Admitting: Physical Therapy

## 2014-11-08 NOTE — Telephone Encounter (Signed)
Left message on machine requesting d/c per MD request

## 2014-11-09 ENCOUNTER — Encounter: Payer: Self-pay | Admitting: Family Medicine
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
## 2014-11-26 ENCOUNTER — Other Ambulatory Visit: Payer: Self-pay | Admitting: Family Medicine

## 2014-11-26 DIAGNOSIS — M542 Cervicalgia: Secondary | ICD-10-CM

## 2014-11-29 MED ORDER — HYDROCODONE-ACETAMINOPHEN 7.5-325 MG PO TABS: 1.0000 | ORAL_TABLET | Freq: Four times a day (QID) | ORAL | Status: DC | PRN

## 2014-11-29 NOTE — Telephone Encounter (Signed)
RX printed, left up front and patient aware to pick up via mychart 

## 2015-02-14 ENCOUNTER — Encounter: Payer: Self-pay | Admitting: Family Medicine

## 2015-03-21 ENCOUNTER — Other Ambulatory Visit: Payer: Self-pay | Admitting: Family Medicine

## 2015-03-21 DIAGNOSIS — M542 Cervicalgia: Secondary | ICD-10-CM

## 2015-03-21 MED ORDER — HYDROCODONE-ACETAMINOPHEN 7.5-325 MG PO TABS: 1.0000 | ORAL_TABLET | Freq: Four times a day (QID) | ORAL | Status: DC | PRN

## 2015-03-21 NOTE — Telephone Encounter (Signed)
Dr. Jeanice Lim approved refill.   Advised that no further refills can be given without F/U with PCP.   Letter sent.

## 2015-03-22 ENCOUNTER — Encounter: Payer: Self-pay | Admitting: Family Medicine

## 2015-04-21 ENCOUNTER — Encounter: Payer: Self-pay | Admitting: Family Medicine

## 2015-11-17 ENCOUNTER — Emergency Department (HOSPITAL_COMMUNITY): Payer: BLUE CROSS/BLUE SHIELD

## 2015-11-17 ENCOUNTER — Encounter (HOSPITAL_COMMUNITY): Payer: Self-pay | Admitting: Emergency Medicine

## 2015-11-17 ENCOUNTER — Emergency Department (HOSPITAL_COMMUNITY)
Admission: EM | Admit: 2015-11-17 | Discharge: 2015-11-17 | Disposition: A | Discharge status: Home / Self Care | Payer: BLUE CROSS/BLUE SHIELD | Attending: Emergency Medicine | Admitting: Emergency Medicine

## 2015-11-17 DIAGNOSIS — Y92410 Unspecified street and highway as the place of occurrence of the external cause: Secondary | ICD-10-CM | POA: Insufficient documentation

## 2015-11-17 DIAGNOSIS — Z79899 Other long term (current) drug therapy: Secondary | ICD-10-CM | POA: Diagnosis not present

## 2015-11-17 DIAGNOSIS — S8992XA Unspecified injury of left lower leg, initial encounter: Secondary | ICD-10-CM | POA: Insufficient documentation

## 2015-11-17 DIAGNOSIS — Y998 Other external cause status: Secondary | ICD-10-CM | POA: Insufficient documentation

## 2015-11-17 DIAGNOSIS — S199XXA Unspecified injury of neck, initial encounter: Secondary | ICD-10-CM | POA: Diagnosis present

## 2015-11-17 DIAGNOSIS — Y9389 Activity, other specified: Secondary | ICD-10-CM | POA: Insufficient documentation

## 2015-11-17 DIAGNOSIS — T07XXXA Unspecified multiple injuries, initial encounter: Secondary | ICD-10-CM

## 2015-11-17 DIAGNOSIS — T148 Other injury of unspecified body region: Secondary | ICD-10-CM | POA: Insufficient documentation

## 2015-11-17 DIAGNOSIS — M25512 Pain in left shoulder: Secondary | ICD-10-CM | POA: Insufficient documentation

## 2015-11-17 DIAGNOSIS — S161XXA Strain of muscle, fascia and tendon at neck level, initial encounter: Secondary | ICD-10-CM | POA: Diagnosis not present

## 2015-11-17 DIAGNOSIS — S29001A Unspecified injury of muscle and tendon of front wall of thorax, initial encounter: Secondary | ICD-10-CM | POA: Diagnosis not present

## 2015-11-17 MED ORDER — NAPROXEN 375 MG PO TABS: 375.0000 mg | ORAL_TABLET | Freq: Two times a day (BID) | ORAL | Status: DC

## 2015-11-17 MED ORDER — IBUPROFEN 800 MG PO TABS
800.0000 mg | ORAL_TABLET | Freq: Once | ORAL | Status: AC
  Administered 2015-11-17: 800 mg via ORAL
  Filled 2015-11-17: qty 1
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
## 2015-11-17 NOTE — ED Notes (Signed)
Patient transported to X-ray 

## 2015-11-21 ENCOUNTER — Ambulatory Visit (INDEPENDENT_AMBULATORY_CARE_PROVIDER_SITE_OTHER): Payer: BLUE CROSS/BLUE SHIELD | Admitting: Family Medicine

## 2015-11-21 ENCOUNTER — Encounter: Payer: Self-pay | Admitting: Family Medicine

## 2015-11-21 VITALS — BP 100/64 | HR 76 | Temp 98.2°F | Resp 14 | Ht 63.75 in | Wt 142.0 lb

## 2015-11-21 DIAGNOSIS — S134XXA Sprain of ligaments of cervical spine, initial encounter: Secondary | ICD-10-CM

## 2015-11-21 MED ORDER — DIAZEPAM 5 MG PO TABS: 5.0000 mg | ORAL_TABLET | Freq: Three times a day (TID) | ORAL | Status: DC | PRN

## 2015-11-21 MED ORDER — OXYCODONE-ACETAMINOPHEN 5-325 MG PO TABS: 1.0000 | ORAL_TABLET | Freq: Three times a day (TID) | ORAL | Status: DC | PRN

## 2015-11-22 ENCOUNTER — Encounter: Payer: Self-pay | Admitting: Family Medicine
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
## 2015-12-09 ENCOUNTER — Other Ambulatory Visit: Payer: Self-pay | Admitting: Family Medicine

## 2015-12-09 DIAGNOSIS — S134XXA Sprain of ligaments of cervical spine, initial encounter: Secondary | ICD-10-CM

## 2015-12-09 MED ORDER — OXYCODONE-ACETAMINOPHEN 5-325 MG PO TABS: 1.0000 | ORAL_TABLET | Freq: Three times a day (TID) | ORAL | Status: DC | PRN

## 2015-12-09 NOTE — Telephone Encounter (Signed)
Rx ready pt made aware with response to My Chart request.

## 2015-12-09 NOTE — Telephone Encounter (Signed)
LRF 11/21/15 #20   LOV 11/21/15  OK refill?

## 2015-12-09 NOTE — Telephone Encounter (Signed)
ok 

## 2018-01-09 ENCOUNTER — Other Ambulatory Visit: Payer: Self-pay

## 2018-01-09 ENCOUNTER — Ambulatory Visit: Payer: BLUE CROSS/BLUE SHIELD | Admitting: Family Medicine

## 2018-01-09 ENCOUNTER — Encounter: Payer: Self-pay | Admitting: Family Medicine

## 2018-01-09 VITALS — BP 118/64 | HR 86 | Temp 98.3°F | Resp 14 | Ht 62.5 in | Wt 140.0 lb

## 2018-01-09 DIAGNOSIS — J209 Acute bronchitis, unspecified: Secondary | ICD-10-CM | POA: Diagnosis not present

## 2018-01-09 MED ORDER — GUAIFENESIN-CODEINE 100-10 MG/5ML PO SOLN: 5.0000 mL | Freq: Four times a day (QID) | ORAL | 0 refills | Status: DC | PRN

## 2018-01-09 MED ORDER — PREDNISONE 10 MG PO TABS: ORAL_TABLET | ORAL | 0 refills | Status: DC

## 2018-01-09 NOTE — Patient Instructions (Addendum)
Take prednisone  Cough medicine Add anti-histamine as needed F/U as needed

## 2018-01-09 NOTE — Progress Notes (Signed)
   Subjective:    Patient ID: Cindy Miles, female    DOB: [birth year], 37 y.o.   MRN: [id number]  Patient presents for Cough (x1 month- has taken ABTx- still has non-productive cough and chest congestion)  Cough with congestion for past month, treated for sinusitis after having sinus drainage for a couple weeks. Taking robitussin/ claritin D/ Tylenol /Sudafed. Then  Given amoxicllin for 1 week from [employer] at her job, still has not improved.    No asthma history, non smoker  Cough worse at night,does get a rattleing and feels a tightness in chest , taking deep breaths causes couging   Son had the flu a few months ago  No travel outside the country   Review Of Systems:  GEN- denies fatigue, fever, weight loss,weakness, recent illness HEENT- denies eye drainage, change in vision, nasal discharge, CVS- denies chest pain, palpitations RESP- denies SOB, +cough, wheeze ABD- denies N/V, change in stools, abd pain GU- denies dysuria, hematuria, dribbling, incontinence MSK- denies joint pain, muscle aches, injury Neuro- denies headache, dizziness, syncope, seizure activity       Objective:    BP 118/64   Pulse 86   Temp 98.3 F (36.8 C) (Oral)   Resp 14   Ht 5' 2.5" (1.588 m)   Wt 140 lb (63.5 kg)   SpO2 96%   BMI 25.20 kg/m  GEN- NAD, alert and oriented x3 HEENT- PERRL, EOMI, non injected sclera, pink conjunctiva, MMM, oropharynx clear, TM clear bilat, nares clear rhinorrhea  Neck- Supple, no LAD CVS- RRR, no murmur RESP-CTAB, harsh cough EXT- No edema Pulses- Radial, DP- 2+        Assessment & Plan:      Problem List Items Addressed This Visit    None    Visit Diagnoses    Acute bronchitis, unspecified organism    -  Primary   Given prednisone, anti-histamine, robitussin codiene      Note: This dictation was prepared with Dragon dictation along with smaller phrase technology. Any transcriptional errors that result from this process are unintentional.

## 2018-01-13 ENCOUNTER — Other Ambulatory Visit: Payer: Self-pay | Admitting: Family Medicine

## 2019-09-08 ENCOUNTER — Other Ambulatory Visit: Payer: BLUE CROSS/BLUE SHIELD

## 2020-06-30 ENCOUNTER — Encounter: Payer: Self-pay | Admitting: Family Medicine

## 2020-06-30 ENCOUNTER — Telehealth: Payer: Self-pay

## 2020-06-30 ENCOUNTER — Other Ambulatory Visit: Payer: Self-pay | Admitting: Family Medicine

## 2020-06-30 MED ORDER — CYCLOBENZAPRINE HCL 10 MG PO TABS: 10.0000 mg | ORAL_TABLET | Freq: Three times a day (TID) | ORAL | 0 refills | Status: AC | PRN

## 2020-06-30 NOTE — Telephone Encounter (Signed)
Pt sent a my chart result on the MRI order by her Chiropractor and needs to follow up with her Primary Care Dr for Medication.   Pt Call back [phone]

## 2020-06-30 NOTE — Telephone Encounter (Signed)
Please call patient to schedule appointment.

## 2020-06-30 NOTE — Telephone Encounter (Signed)
Pt will make appt in morning

## 2020-07-01 ENCOUNTER — Other Ambulatory Visit: Payer: Self-pay

## 2020-07-01 ENCOUNTER — Other Ambulatory Visit: Payer: Self-pay | Admitting: Family Medicine

## 2020-07-01 ENCOUNTER — Ambulatory Visit: Payer: No Typology Code available for payment source | Admitting: Family Medicine

## 2020-07-01 VITALS — BP 120/80 | HR 79 | Temp 97.9°F | Ht 64.0 in | Wt 144.0 lb

## 2020-07-01 DIAGNOSIS — M5431 Sciatica, right side: Secondary | ICD-10-CM

## 2020-07-01 MED ORDER — OXYCODONE-ACETAMINOPHEN 7.5-325 MG PO TABS: 1.0000 | ORAL_TABLET | ORAL | 0 refills | Status: DC | PRN

## 2020-07-01 MED ORDER — PREDNISONE 20 MG PO TABS: ORAL_TABLET | ORAL | 0 refills | Status: DC

## 2020-07-01 MED ORDER — OXYCODONE-ACETAMINOPHEN 7.5-325 MG PO TABS: 1.0000 | ORAL_TABLET | ORAL | 0 refills | Status: AC | PRN

## 2020-07-01 NOTE — Progress Notes (Signed)
Subjective:    Patient ID: Cindy Miles, female    DOB: 1981-06-24, 39 y.o.   MRN: [id number]  HPI Patient is a very pleasant 39 year old Caucasian female here today complaining of low back pain.  It began a little more than a week ago.  The pain started in the middle of her lower back and began to radiate into her right posterior buttocks and down her right leg into her right foot.  She now states that her right leg feels numb.  She denies any weakness in her right leg.  She has 5/5 equal and symmetric strength with hip flexion and hip extension, knee flexion and knee extension, and dorsi and plantar flexion of the ankles bilaterally.  She has normal 2/4 reflexes at the patella and the Achilles bilaterally.  She has a positive right-sided straight leg raise.  She is unable to sit down.  She has to stand for comfort.  Sitting exacerbates the pain.  She recently had an MRI performed at an outside facility by a chiropractor which showed a herniated disc at L5-S1 with right sided S1 nerve compression.  This is most likely causing her sciatica. Past Medical History:  Diagnosis Date  . Cervical strain 11/05/2014   Past Surgical History:  Procedure Laterality Date  . CESAREAN SECTION  2010 and 2011   Current Outpatient Medications on File Prior to Visit  Medication Sig Dispense Refill  . cyclobenzaprine (FLEXERIL) 10 MG tablet Take 1 tablet (10 mg total) by mouth 3 (three) times daily as needed for muscle spasms. 30 tablet 0  . guaiFENesin-codeine 100-10 MG/5ML syrup Take 5 mLs by mouth every 6 (six) hours as needed. 180 mL 0  . PRESCRIPTION MEDICATION prescription face cream from Dermatologist     No current facility-administered medications on file prior to visit.   No Known Allergies Social History   Socioeconomic History  . Marital status: Married    Spouse name: Not on file  . Number of children: 2  . Years of education: Masters  . Highest education level: Not on file    Occupational History    Employer: [employer]  Tobacco Use  . Smoking status: Never Smoker  . Smokeless tobacco: Never Used  Substance and Sexual Activity  . Alcohol use: No  . Drug use: No  . Sexual activity: Not on file  Other Topics Concern  . Not on file  Social History Narrative   Patient is right handed   Patient drinks one cup caffeine daily.   Social Determinants of Health   Financial Resource Strain:   . Difficulty of Paying Living Expenses: Not on file  Food Insecurity:   . Worried About Programme researcher, broadcasting/film/video in the Last Year: Not on file  . Ran Out of Food in the Last Year: Not on file  Transportation Needs:   . Lack of Transportation (Medical): Not on file  . Lack of Transportation (Non-Medical): Not on file  Physical Activity:   . Days of Exercise per Week: Not on file  . Minutes of Exercise per Session: Not on file  Stress:   . Feeling of Stress : Not on file  Social Connections:   . Frequency of Communication with Friends and Family: Not on file  . Frequency of Social Gatherings with Friends and Family: Not on file  . Attends Religious Services: Not on file  . Active Member of Clubs or Organizations: Not on file  . Attends Banker Meetings: Not  on file  . Marital Status: Not on file  Intimate Partner Violence:   . Fear of Current or Ex-Partner: Not on file  . Emotionally Abused: Not on file  . Physically Abused: Not on file  . Sexually Abused: Not on file      Review of Systems  All other systems reviewed and are negative.      Objective:   Physical Exam Vitals reviewed.  Constitutional:      Appearance: Normal appearance. She is normal weight.  Cardiovascular:     Rate and Rhythm: Normal rate and regular rhythm.     Heart sounds: Normal heart sounds.  Pulmonary:     Effort: Pulmonary effort is normal.     Breath sounds: Normal breath sounds.  Musculoskeletal:     Lumbar back: No signs of trauma, spasms, tenderness or  bony tenderness. Decreased range of motion. Positive right straight leg raise test.       Legs:  Neurological:     General: No focal deficit present.     Mental Status: She is alert and oriented to person, place, and time.     Sensory: Sensory deficit present.     Motor: No weakness.     Coordination: Coordination normal.     Gait: Gait normal.     Deep Tendon Reflexes: Reflexes normal.           Assessment & Plan:  Right sided sciatica  Patient has right-sided sciatica due to herniated disc at L5-S1 causing right-sided S1 nerve root compression.  Begin prednisone taper pack.  Use Percocet every 4-6 hours as needed for severe pain.  Reassess in 1 week.  If no better consider referral to interventional radiology for an epidural steroid injection at L5-S1

## 2020-07-05 ENCOUNTER — Other Ambulatory Visit: Payer: Self-pay | Admitting: Family Medicine

## 2020-07-05 DIAGNOSIS — M5431 Sciatica, right side: Secondary | ICD-10-CM

## 2020-07-06 ENCOUNTER — Other Ambulatory Visit: Payer: Self-pay | Admitting: Family Medicine

## 2020-07-06 DIAGNOSIS — M5431 Sciatica, right side: Secondary | ICD-10-CM

## 2020-07-07 ENCOUNTER — Ambulatory Visit
Admission: RE | Admit: 2020-07-07 | Discharge: 2020-07-07 | Disposition: A | Discharge status: Home / Self Care | Payer: No Typology Code available for payment source | Source: Ambulatory Visit | Attending: Family Medicine | Admitting: Family Medicine

## 2020-07-07 ENCOUNTER — Other Ambulatory Visit: Payer: Self-pay

## 2020-07-07 DIAGNOSIS — M5431 Sciatica, right side: Secondary | ICD-10-CM

## 2020-07-07 MED ORDER — METHYLPREDNISOLONE ACETATE 40 MG/ML INJ SUSP (RADIOLOG
120.0000 mg | Freq: Once | INTRAMUSCULAR | Status: AC
  Administered 2020-07-07: 120 mg via EPIDURAL

## 2020-07-07 MED ORDER — IOPAMIDOL (ISOVUE-M 200) INJECTION 41%
1.0000 mL | Freq: Once | INTRAMUSCULAR | Status: AC
  Administered 2020-07-07: 1 mL via EPIDURAL

## 2020-07-07 NOTE — Discharge Instructions (Signed)

## 2020-07-11 ENCOUNTER — Telehealth: Payer: Self-pay | Admitting: Family Medicine

## 2020-07-11 NOTE — Telephone Encounter (Signed)
Refill Oxycodone 

## 2020-07-12 NOTE — Telephone Encounter (Signed)
Received fax from [facility] and covermymeds that a Prior Authorization is needed for oxycodone acetaminophen 7.5-325 mg Key [id number]

## 2020-07-14 ENCOUNTER — Encounter: Payer: Self-pay | Admitting: Family Medicine

## 2020-07-15 ENCOUNTER — Ambulatory Visit (INDEPENDENT_AMBULATORY_CARE_PROVIDER_SITE_OTHER): Payer: No Typology Code available for payment source | Admitting: Family Medicine

## 2020-07-15 ENCOUNTER — Encounter: Payer: Self-pay | Admitting: Family Medicine

## 2020-07-15 ENCOUNTER — Other Ambulatory Visit: Payer: Self-pay

## 2020-07-15 VITALS — BP 120/80 | HR 66 | Resp 18 | Wt 141.0 lb

## 2020-07-15 DIAGNOSIS — M5431 Sciatica, right side: Secondary | ICD-10-CM | POA: Diagnosis not present

## 2020-07-15 DIAGNOSIS — M5127 Other intervertebral disc displacement, lumbosacral region: Secondary | ICD-10-CM | POA: Insufficient documentation

## 2020-07-15 MED ORDER — OXYCODONE-ACETAMINOPHEN 5-325 MG PO TABS: 1.0000 | ORAL_TABLET | ORAL | 0 refills | Status: AC | PRN

## 2020-07-15 MED ORDER — MELOXICAM 15 MG PO TABS: 15.0000 mg | ORAL_TABLET | Freq: Every day | ORAL | 0 refills | Status: AC

## 2020-07-15 MED ORDER — GABAPENTIN 300 MG PO CAPS: 300.0000 mg | ORAL_CAPSULE | Freq: Three times a day (TID) | ORAL | 3 refills | Status: AC | PRN

## 2020-07-15 NOTE — Telephone Encounter (Signed)
Done today.

## 2020-07-15 NOTE — Progress Notes (Signed)
Subjective:    Patient ID: Cindy Miles, female    DOB: May 27, 1981, 39 y.o.   MRN: [id number]  HPI  07/01/20 Patient is a very pleasant 39 year old Caucasian female here today complaining of low back pain.  It began a little more than a week ago.  The pain started in the middle of her lower back and began to radiate into her right posterior buttocks and down her right leg into her right foot.  She now states that her right leg feels numb.  She denies any weakness in her right leg.  She has 5/5 equal and symmetric strength with hip flexion and hip extension, knee flexion and knee extension, and dorsi and plantar flexion of the ankles bilaterally.  She has normal 2/4 reflexes at the patella and the Achilles bilaterally.  She has a positive right-sided straight leg raise.  She is unable to sit down.  She has to stand for comfort.  Sitting exacerbates the pain.  She recently had an MRI performed at an outside facility by a chiropractor which showed a herniated disc at L5-S1 with right sided S1 nerve compression.  This is most likely causing her sciatica.  At that time, my plan was: Patient has right-sided sciatica due to herniated disc at L5-S1 causing right-sided S1 nerve root compression.  Begin prednisone taper pack.  Use Percocet every 4-6 hours as needed for severe pain.  Reassess in 1 week.  If no better consider referral to interventional radiology for an epidural steroid injection at L5-S1  07/15/20 Had ESI 1 week ago.  Patient saw very little if any improvement.  She is still barely able to sit down.  She stands throughout our entire encounter.  She is miserable.  She reports persistent numbness in her right leg.  She also has burning stinging pain radiating down her right leg and constant low back pain.  Sitting exacerbates the pain.  She denies any bowel or bladder incontinence.  She denies any saddle anesthesia.  She is less than 2 weeks out from an epidural steroid injection and is asking  if she can have another 1. Past Medical History:  Diagnosis Date  . Cervical strain 11/05/2014   Past Surgical History:  Procedure Laterality Date  . CESAREAN SECTION  2010 and 2011   Current Outpatient Medications on File Prior to Visit  Medication Sig Dispense Refill  . cyclobenzaprine (FLEXERIL) 10 MG tablet Take 1 tablet (10 mg total) by mouth 3 (three) times daily as needed for muscle spasms. 30 tablet 0  . guaiFENesin-codeine 100-10 MG/5ML syrup Take 5 mLs by mouth every 6 (six) hours as needed. 180 mL 0  . predniSONE (DELTASONE) 20 MG tablet 3 tabs poqday 1-2, 2 tabs poqday 3-4, 1 tab poqday 5-6 12 tablet 0  . PRESCRIPTION MEDICATION prescription face cream from Dermatologist     No current facility-administered medications on file prior to visit.   No Known Allergies Social History   Socioeconomic History  . Marital status: Married    Spouse name: Not on file  . Number of children: 2  . Years of education: Masters  . Highest education level: Not on file  Occupational History    Employer: [employer]  Tobacco Use  . Smoking status: Never Smoker  . Smokeless tobacco: Never Used  Substance and Sexual Activity  . Alcohol use: No  . Drug use: No  . Sexual activity: Not on file  Other Topics Concern  . Not on file  Social History Narrative   Patient is right handed   Patient drinks one cup caffeine daily.   Social Determinants of Health   Financial Resource Strain:   . Difficulty of Paying Living Expenses: Not on file  Food Insecurity:   . Worried About Programme researcher, broadcasting/film/video in the Last Year: Not on file  . Ran Out of Food in the Last Year: Not on file  Transportation Needs:   . Lack of Transportation (Medical): Not on file  . Lack of Transportation (Non-Medical): Not on file  Physical Activity:   . Days of Exercise per Week: Not on file  . Minutes of Exercise per Session: Not on file  Stress:   . Feeling of Stress : Not on file  Social Connections:   .  Frequency of Communication with Friends and Family: Not on file  . Frequency of Social Gatherings with Friends and Family: Not on file  . Attends Religious Services: Not on file  . Active Member of Clubs or Organizations: Not on file  . Attends Banker Meetings: Not on file  . Marital Status: Not on file  Intimate Partner Violence:   . Fear of Current or Ex-Partner: Not on file  . Emotionally Abused: Not on file  . Physically Abused: Not on file  . Sexually Abused: Not on file      Review of Systems  All other systems reviewed and are negative.      Objective:   Physical Exam Vitals reviewed.  Constitutional:      Appearance: Normal appearance. She is normal weight.  Cardiovascular:     Rate and Rhythm: Normal rate and regular rhythm.     Heart sounds: Normal heart sounds.  Pulmonary:     Effort: Pulmonary effort is normal.     Breath sounds: Normal breath sounds.  Musculoskeletal:     Lumbar back: No signs of trauma, spasms, tenderness or bony tenderness. Decreased range of motion. Positive right straight leg raise test.       Legs:  Neurological:     General: No focal deficit present.     Mental Status: She is alert and oriented to person, place, and time.     Sensory: Sensory deficit present.     Motor: No weakness.     Coordination: Coordination normal.     Gait: Gait normal.     Deep Tendon Reflexes: Reflexes normal.           Assessment & Plan:  Right sided sciatica  Patient has numbness in her right leg now for 3 weeks and worsening pain despite trying oral prednisone as well as an epidural steroid injection at L5-S1.  At this point I believe she would benefit from a neurosurgical consultation prior to receiving a second epidural steroid injection.  Meanwhile I will start the patient on gabapentin 300 mg 3 times a day along with meloxicam 15 mg a day.  I did give the patient Percocet 5/325 1 p.o. every 6 hours as needed pain.  I will give her  90 tablets which should be sufficient to last 1 to 2 months because I do believe this is likely going to be a long-term issue over the next 6 weeks.  I will try to have this approved by her insurance which denied by previous prescription

## 2020-07-16 NOTE — Telephone Encounter (Signed)
Received fax back stating this has been approved valid from 07/15/20-07/15/21 as long as patient continues with her current benefit plan.

## 2020-07-21 ENCOUNTER — Encounter: Payer: Self-pay | Admitting: Neurology

## 2020-07-21 ENCOUNTER — Other Ambulatory Visit: Payer: Self-pay

## 2020-07-21 DIAGNOSIS — R202 Paresthesia of skin: Secondary | ICD-10-CM

## 2020-07-25 HISTORY — PX: MICRODISCECTOMY LUMBAR: SUR864

## 2020-07-26 ENCOUNTER — Ambulatory Visit: Payer: No Typology Code available for payment source | Admitting: Neurology

## 2020-07-26 ENCOUNTER — Other Ambulatory Visit: Payer: Self-pay

## 2020-07-26 DIAGNOSIS — R202 Paresthesia of skin: Secondary | ICD-10-CM | POA: Diagnosis not present

## 2020-07-26 NOTE — Procedures (Signed)
[facility]  [address] [place], [address]  [place], [place] [address] Tel: [phone] Fax:  [phone] Test Date:  07/26/2020  Patient: Cindy Miles DOB: May 01, 1981 Physician: Nita Sickle, DO  Sex: Female Height: 5\' 4"  Ref Phys:  ID#: Monica Martinez   Technician:    Patient Complaints: This is a 39 year old female referred for evaluation of bilateral hand paresthesias.  NCV & EMG Findings: Extensive electrodiagnostic testing of the right upper extremity and additional studies of the left shows:  1. Bilateral median, ulnar, and mixed palmar sensory responses are within normal limits. 2. Bilateral median and ulnar motor responses are within normal limits. 3. There is no evidence of active or chronic motor axonal loss changes affecting any of the tested muscles.  Motor unit configuration and recruitment pattern is within normal limits.   Impression: This is a normal study of the upper extremities.  In particular, there is no evidence of carpal tunnel syndrome or a cervical radiculopathy.   ___________________________ 24, DO    Nerve Conduction Studies Anti Sensory Summary Table   Stim Site NR Peak (ms) Norm Peak (ms) P-T Amp (V) Norm P-T Amp  Left Median Anti Sensory (2nd Digit)  32C  Wrist    2.9 <3.4 72.3 >20  Right Median Anti Sensory (2nd Digit)  32C  Wrist    3.1 <3.4 78.2 >20  Left Ulnar Anti Sensory (5th Digit)  32C  Wrist    2.7 <3.1 77.8 >12  Right Ulnar Anti Sensory (5th Digit)  32C  Wrist    2.6 <3.1 76.4 >12   Motor Summary Table   Stim Site NR Onset (ms) Norm Onset (ms) O-P Amp (mV) Norm O-P Amp Site1 Site2 Delta-0 (ms) Dist (cm) Vel (m/s) Norm Vel (m/s)  Left Median Motor (Abd Poll Brev)  32C  Wrist    2.9 <3.9 10.9 >6 Elbow Wrist 4.2 25.0 60 >50  Elbow    7.1  10.4         Right Median Motor (Abd Poll Brev)  32C  Wrist    3.0 <3.9 10.0 >6 Elbow Wrist 4.0 25.0 63 >50  Elbow    7.0  10.0         Left Ulnar  Motor (Abd Dig Minimi)  32C  Wrist    2.3 <3.1 14.0 >7 B Elbow Wrist 3.3 21.0 64 >50  B Elbow    5.6  13.6  A Elbow B Elbow 1.6 10.0 62 >50  A Elbow    7.2  13.6         Right Ulnar Motor (Abd Dig Minimi)  32C  Wrist    2.3 <3.1 13.1 >7 B Elbow Wrist 3.3 21.0 64 >50  B Elbow    5.6  12.7  A Elbow B Elbow 1.6 10.0 62 >50  A Elbow    7.2  12.7          Comparison Summary Table   Stim Site NR Peak (ms) Norm Peak (ms) P-T Amp (V) Site1 Site2 Delta-P (ms) Norm Delta (ms)  Left Median/Ulnar Palm Comparison (Wrist - 8cm)  32C  Median Palm    1.5 <2.2 99.0 Median Palm Ulnar Palm 0.2   Ulnar Palm    1.3 <2.2 38.0      Right Median/Ulnar Palm Comparison (Wrist - 8cm)  32C  Median Palm    1.7 <2.2 83.9 Median Palm Ulnar Palm 0.0   Ulnar Palm    1.7 <2.2 20.7  EMG   Side Muscle Ins Act Fibs Psw Fasc Number Recrt Dur Dur. Amp Amp. Poly Poly. Comment  Right 1stDorInt Nml Nml Nml Nml Nml Nml Nml Nml Nml Nml Nml Nml N/A  Right PronatorTeres Nml Nml Nml Nml Nml Nml Nml Nml Nml Nml Nml Nml N/A  Right Biceps Nml Nml Nml Nml Nml Nml Nml Nml Nml Nml Nml Nml N/A  Right Triceps Nml Nml Nml Nml Nml Nml Nml Nml Nml Nml Nml Nml N/A  Right Deltoid Nml Nml Nml Nml Nml Nml Nml Nml Nml Nml Nml Nml N/A  Left 1stDorInt Nml Nml Nml Nml Nml Nml Nml Nml Nml Nml Nml Nml N/A  Left PronatorTeres Nml Nml Nml Nml Nml Nml Nml Nml Nml Nml Nml Nml N/A  Left Biceps Nml Nml Nml Nml Nml Nml Nml Nml Nml Nml Nml Nml N/A  Left Triceps Nml Nml Nml Nml Nml Nml Nml Nml Nml Nml Nml Nml N/A  Left Deltoid Nml Nml Nml Nml Nml Nml Nml Nml Nml Nml Nml Nml N/A      Waveforms:

## 2020-09-06 ENCOUNTER — Encounter: Payer: No Typology Code available for payment source | Admitting: Neurology

## 2021-06-27 IMAGING — XA Imaging study
2 series · 2 of 2 positions shown · non-contrast
Comparison: none

CLINICAL DATA: Right posterolateral disc herniation L5-S1 with
right radicular pain. Spondylosis without myelopathy.

[Series 1: ortho standard · 1 of 1 slices shown (1 of 2)]
[im 1/1]
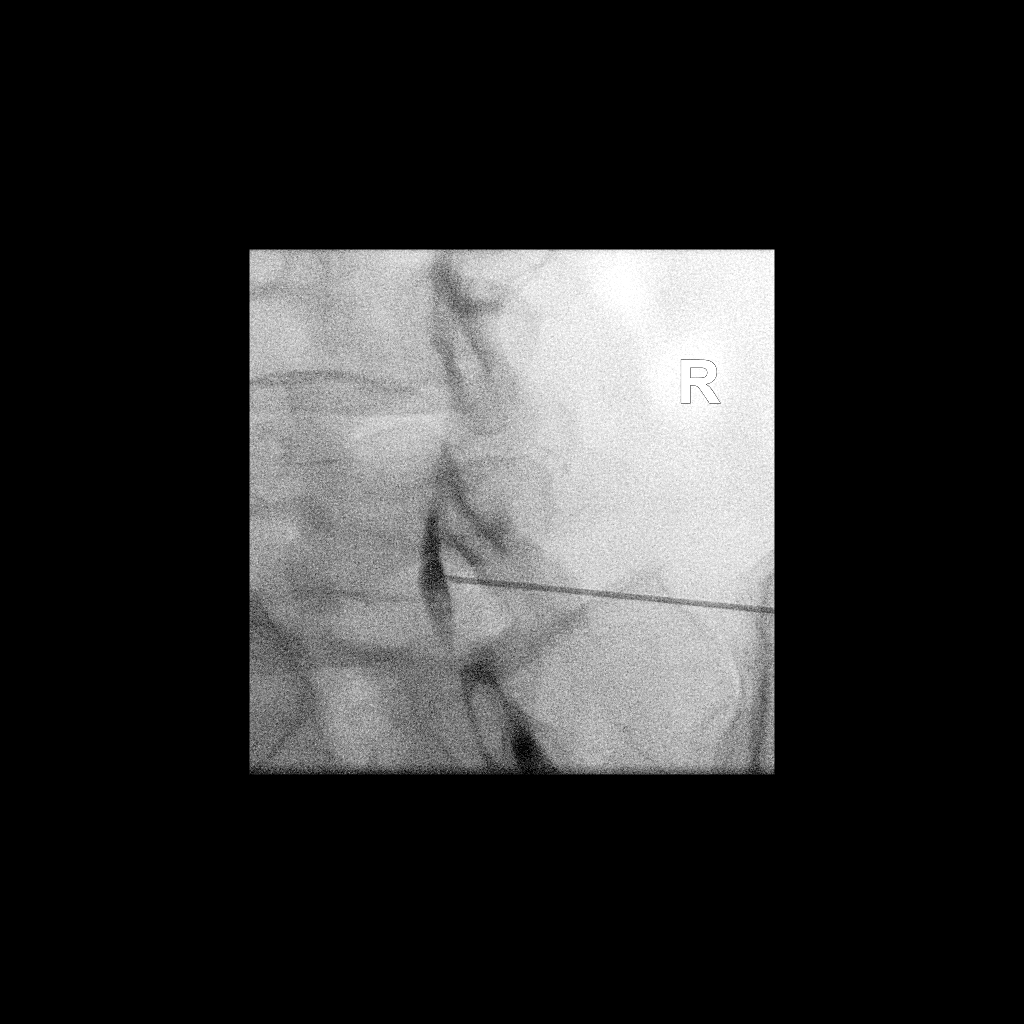

[Series 2: ortho standard · 1 of 1 slices shown (2 of 2)]
[im 1/1]
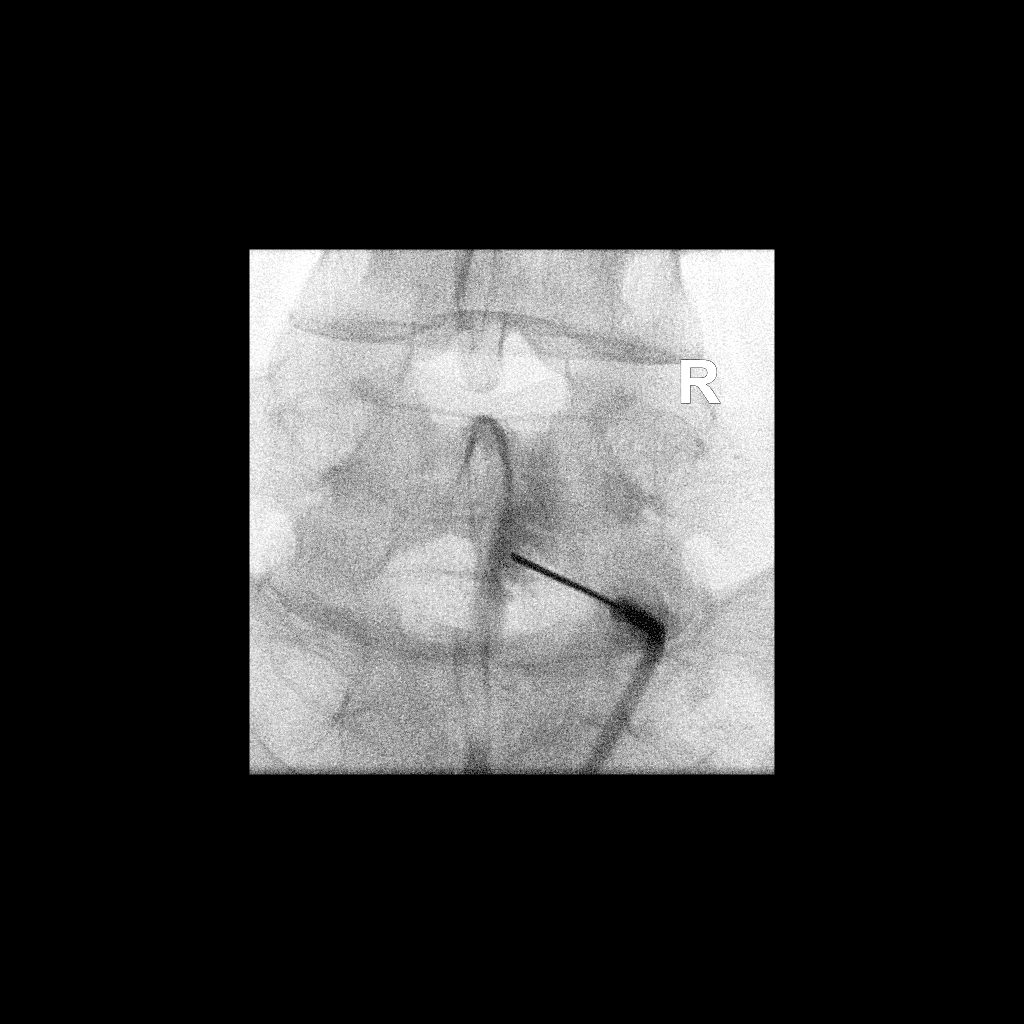

[2 of 2 positions shown; findings below may reference images not displayed]

FLUOROSCOPY TIME:  0 minutes 20 seconds. 7.96 micro gray meter
squared

PROCEDURE:
The procedure, risks, benefits, and alternatives were explained to
the patient. Questions regarding the procedure were encouraged and
answered. The patient understands and consents to the procedure.

LUMBAR EPIDURAL INJECTION:

An interlaminar approach was performed on the right at L5-S1. The
overlying skin was cleansed and anesthetized. A 20 gauge epidural
needle was advanced using loss-of-resistance technique.

DIAGNOSTIC EPIDURAL INJECTION:

Injection of Isovue-M 200 shows a good epidural pattern with spread
above and below the level of needle placement, primarily on the
right. No vascular opacification is seen.

THERAPEUTIC EPIDURAL INJECTION:

One hundred twenty mg of Depo-Medrol mixed with 2 cc 1% lidocaine
were instilled. The procedure was well-tolerated, and the patient
was discharged thirty minutes following the injection in good
condition.

COMPLICATIONS:
None
IMPRESSION: Technically successful epidural injection on the right L5-S1 # 1.

## 2021-10-30 ENCOUNTER — Emergency Department (HOSPITAL_COMMUNITY)
Admission: EM | Admit: 2021-10-30 | Discharge: 2021-10-30 | Discharge status: Against Medical Advice | Payer: No Typology Code available for payment source | Source: Home / Self Care

## 2021-10-30 ENCOUNTER — Ambulatory Visit: Admit: 2021-10-30 | Discharge status: Absent without leave | Payer: No Typology Code available for payment source

## 2022-01-22 ENCOUNTER — Other Ambulatory Visit: Payer: Self-pay | Admitting: Obstetrics and Gynecology

## 2022-01-22 DIAGNOSIS — Z803 Family history of malignant neoplasm of breast: Secondary | ICD-10-CM

## 2022-02-15 ENCOUNTER — Encounter: Payer: Self-pay | Admitting: Gastroenterology

## 2022-02-15 ENCOUNTER — Ambulatory Visit: Payer: No Typology Code available for payment source | Admitting: Gastroenterology

## 2022-02-15 VITALS — BP 110/70 | HR 72 | Resp 14 | Ht 64.0 in | Wt 142.8 lb

## 2022-02-15 DIAGNOSIS — Z800 Family history of malignant neoplasm of digestive organs: Secondary | ICD-10-CM

## 2022-02-15 MED ORDER — SUTAB 1479-225-188 MG PO TABS: 1.0000 | ORAL_TABLET | Freq: Once | ORAL | 0 refills | Status: AC

## 2022-02-15 NOTE — Progress Notes (Signed)
HPI : Cindy Miles is a very pleasant 41 year old female with no significant chronic medical history who is referred to [place] by Dr. Lynnea Ferrier for early colon cancer screening due to family history of colon cancer.  The patient's father was diagnosed with colon cancer at age 20.  Reportedly his father was also diagnosed with colon cancer at an unknown age.  Patient also has family history of uterine cancer and underwent genetic testing as recommended by her gynecologist.  Genetic testing was negative for any mutations. The patient denies any chronic GI symptoms.  Specifically, she denies issues with abdominal pain, constipation, diarrhea or blood in the stool.  She also denies any upper GI symptoms to include heartburn, acid regurgitation, nausea/vomiting or dysphagia.  Her weight has been stable.  She has no known cardiopulmonary comorbidities.   Past Medical History:  Diagnosis Date   Cervical strain 11/05/2014   Herniated nucleus pulposus, L5-S1     Past Surgical History:  Procedure Laterality Date   CESAREAN SECTION  2010 and 2011   MICRODISCECTOMY LUMBAR  07/25/2020  Hemorrhoidectomy 2013  Family History  Problem Relation Age of Onset   Healthy Mother    Colon cancer Father    Healthy Father    Healthy Brother    Uterine cancer Maternal Grandmother    Liver cancer Maternal Grandmother    Colon cancer Paternal Grandfather    Esophageal cancer Neg Hx    Pancreatic cancer Neg Hx    Stomach cancer Neg Hx    Social History   Tobacco Use   Smoking status: Never   Smokeless tobacco: Never  Substance Use Topics   Alcohol use: No   Drug use: No   Current Outpatient Medications  Medication Sig Dispense Refill   cyclobenzaprine (FLEXERIL) 10 MG tablet Take 1 tablet (10 mg total) by mouth 3 (three) times daily as needed for muscle spasms. 30 tablet 0   gabapentin (NEURONTIN) 300 MG capsule Take 1 capsule (300 mg total) by mouth 3 (three) times daily as needed (nerve  pain). 90 capsule 3   meloxicam (MOBIC) 15 MG tablet Take 1 tablet (15 mg total) by mouth daily. 30 tablet 0   PRESCRIPTION MEDICATION prescription face cream from Dermatologist     No current facility-administered medications for this visit.   No Known Allergies   Review of Systems: All systems reviewed and negative except where noted in HPI.    No results found.  Physical Exam: BP 110/70 (BP Location: Right Arm, Patient Position: Sitting, Cuff Size: Normal)   Pulse 72   Resp 14   Ht 5\' 4"  (1.626 m)   Wt 142 lb 12.8 oz (64.8 kg)   LMP 01/27/2022   SpO2 97%   BMI 24.51 kg/m  Constitutional: Pleasant,well-developed, Caucasian female in no acute distress. HEENT: Normocephalic and atraumatic. Conjunctivae are normal. No scleral icterus. Neck supple.  Cardiovascular: Normal rate, regular rhythm.  Pulmonary/chest: Effort normal and breath sounds normal. No wheezing, rales or rhonchi. Abdominal: Soft, nondistended, nontender. Bowel sounds active throughout. There are no masses palpable. No hepatomegaly. Extremities: no edema Neurological: Alert and oriented to person place and time. Skin: Skin is warm and dry. No rashes noted. Psychiatric: Normal mood and affect. Behavior is normal.  CBC    Component Value Date/Time   WBC 8.1 03/22/2010 0520   RBC 3.41 (L) 03/22/2010 0520   HGB 9.3 (L) 03/22/2010 0520   HCT 27.2 (L) 03/22/2010 0520   PLT 184 03/22/2010 0520  MCV 79.9 03/22/2010 0520   MCH 27.2 03/22/2010 0520   MCHC 34.1 03/22/2010 0520   RDW 16.9 (H) 03/22/2010 0520    CMP  No results found for: NA, K, CL, CO2, GLUCOSE, BUN, CREATININE, CALCIUM, PROT, ALBUMIN, AST, ALT, ALKPHOS, BILITOT, GFRNONAA, GFRAA   ASSESSMENT AND PLAN: 41 year old female with first-degree relative diagnosed colon cancer for the age of 76 (father, 74) overdue for initial high risk screening colonoscopy.  She has no chronic GI symptoms.  Patient will need a colonoscopy every 5 years per  guidelines based on his family history.  If the patient has a large polyp or numerous polyps, she may need colonoscopy sooner than 5 years.  Family history of colon cancer -Colonoscopy  The details, risks (including bleeding, perforation, infection, missed lesions, medication reactions and possible hospitalization or surgery if complications occur), benefits, and alternatives to colonoscopy with possible biopsy and possible polypectomy were discussed with the patient and she consents to proceed.   Mitra Duling E. Tomasa Rand, MD [facility]   Pickard, Priscille Heidelberg, MD

## 2022-02-15 NOTE — Patient Instructions (Signed)
If you are age 41 or older, your body mass index should be between 23-30. Your Body mass index is 24.51 kg/m?. If this is out of the aforementioned range listed, please consider follow up with your Primary Care Provider. ? ?If you are age 64 or younger, your body mass index should be between 19-25. Your Body mass index is 24.51 kg/m?. If this is out of the aformentioned range listed, please consider follow up with your Primary Care Provider.  ? ?You have been scheduled for a colonoscopy. Please follow written instructions given to you at your visit today.  ?Please pick up your prep supplies at the pharmacy within the next 1-3 days. ?If you use inhalers (even only as needed), please bring them with you on the day of your procedure. ? ? ?The [facility] providers would like to encourage you to use MYCHART to communicate with providers for non-urgent requests or questions.  Due to long hold times on the telephone, sending your provider a message by MYCHART may be a faster and more efficient way to get a response.  Please allow 48 business hours for a response.  Please remember that this is for non-urgent requests.  ? ?It was a pleasure to see you today! ? ?Thank you for trusting me with your gastrointestinal care!   ? ?Scott E.Cunningham, MD  ? ?

## 2022-02-22 ENCOUNTER — Telehealth: Payer: Self-pay | Admitting: Gastroenterology

## 2022-02-22 NOTE — Telephone Encounter (Signed)
Patient called and said when she was here for her appointment you said to call and have you check to see if you had any tablet prep samples she could get before she goes to her pharmacy and has to purchase them.  Please call patient and advise.  Thank you.

## 2022-02-22 NOTE — Telephone Encounter (Signed)
Returned patients call. Patient stated that she could come by tomorrow morning to pick up a prep kit.

## 2022-04-19 ENCOUNTER — Encounter: Payer: Self-pay | Admitting: Gastroenterology

## 2022-04-19 ENCOUNTER — Ambulatory Visit (AMBULATORY_SURGERY_CENTER): Payer: No Typology Code available for payment source | Admitting: Gastroenterology

## 2022-04-19 VITALS — BP 95/51 | HR 65 | Temp 97.7°F | Resp 14 | Ht 64.0 in | Wt 142.0 lb

## 2022-04-19 DIAGNOSIS — Z800 Family history of malignant neoplasm of digestive organs: Secondary | ICD-10-CM | POA: Diagnosis not present

## 2022-04-19 DIAGNOSIS — Z1211 Encounter for screening for malignant neoplasm of colon: Secondary | ICD-10-CM | POA: Diagnosis present

## 2022-04-19 MED ORDER — SODIUM CHLORIDE 0.9 % IV SOLN: 500.0000 mL | Freq: Once | INTRAVENOUS | Status: DC

## 2022-04-19 NOTE — Patient Instructions (Signed)
Resume previous diet.  Continue present medications.  Repeat colonoscopy in 5 years for screening purposes.  YOU HAD AN ENDOSCOPIC PROCEDURE TODAY AT THE [facility]:   Refer to the procedure report that was given to you for any specific questions about what was found during the examination.  If the procedure report does not answer your questions, please call your gastroenterologist to clarify.  If you requested that your care partner not be given the details of your procedure findings, then the procedure report has been included in a sealed envelope for you to review at your convenience later.  YOU SHOULD EXPECT: Some feelings of bloating in the abdomen. Passage of more gas than usual.  Walking can help get rid of the air that was put into your GI tract during the procedure and reduce the bloating. If you had a lower endoscopy (such as a colonoscopy or flexible sigmoidoscopy) you may notice spotting of blood in your stool or on the toilet paper. If you underwent a bowel prep for your procedure, you may not have a normal bowel movement for a few days.  Please Note:  You might notice some irritation and congestion in your nose or some drainage.  This is from the oxygen used during your procedure.  There is no need for concern and it should clear up in a day or so.  SYMPTOMS TO REPORT IMMEDIATELY:  Following lower endoscopy (colonoscopy or flexible sigmoidoscopy):  Excessive amounts of blood in the stool  Significant tenderness or worsening of abdominal pains  Swelling of the abdomen that is new, acute  Fever of 100F or higher  For urgent or emergent issues, a gastroenterologist can be reached at any hour by calling [phone]. Do not use MyChart messaging for urgent concerns.    DIET:  We do recommend a small meal at first, but then you may proceed to your regular diet.  Drink plenty of fluids but you should avoid alcoholic beverages for 24 hours.  ACTIVITY:  You should plan to  take it easy for the rest of today and you should NOT DRIVE or use heavy machinery until tomorrow (because of the sedation medicines used during the test).    FOLLOW UP: Our staff will call the number listed on your records the next business day following your procedure.  We will call around 7:15- 8:00 am to check on you and address any questions or concerns that you may have regarding the information given to you following your procedure. If we do not reach you, we will leave a message.  If you develop any symptoms (ie: fever, flu-like symptoms, shortness of breath, cough etc.) before then, please call [phone].  If you test positive for Covid 19 in the 2 weeks post procedure, please call and report this information to [place].    If any biopsies were taken you will be contacted by phone or by letter within the next 1-3 weeks.  Please call us at [phone] if you have not heard about the biopsies in 3 weeks.    SIGNATURES/CONFIDENTIALITY: You and/or your care partner have signed paperwork which will be entered into your electronic medical record.  These signatures attest to the fact that that the information above on your After Visit Summary has been reviewed and is understood.  Full responsibility of the confidentiality of this discharge information lies with you and/or your care-partner.

## 2022-04-19 NOTE — Progress Notes (Signed)
Vitals-CW ?

## 2022-04-19 NOTE — Progress Notes (Signed)
To pacu, VSS. Report to [email or web address] 

## 2022-04-19 NOTE — Progress Notes (Signed)
[facility] History and Physical   Primary Care Physician:  Donita Brooks, MD   Reason for Procedure:   Colon cancer screening, high risk  Plan:    Colonoscopy     HPI: Cindy Miles is a 41 y.o. female undergoing initial screening colonoscopy.  Her father was diagnosed with colon cancer at age 60.  She has no chronic GI symptoms.    Past Medical History:  Diagnosis Date   Cervical strain 11/05/2014   Herniated nucleus pulposus, L5-S1     Past Surgical History:  Procedure Laterality Date   CESAREAN SECTION  2010 and 2011   MICRODISCECTOMY LUMBAR  07/25/2020    Prior to Admission medications   Medication Sig Start Date End Date Taking? Authorizing Provider  Sulfacetamide Sodium-Sulfur (AVAR CLEANSER) 10-5 % EMUL Avar 10 %-5 % (w/w) topical cleanser  APPLY TO THE AFFECTED AREA EVERY DAY   Yes [provider]  Sulfacetamide Sodium-Sulfur (AVAR CLEANSER) 10-5 % LIQD Avar 10 %-5 % (w/w) topical cleanser  APPLY TO THE AFFECTED AREA EVERY DAY   Yes [provider]  amoxicillin (AMOXIL) 875 MG tablet     [provider]  cyclobenzaprine (FLEXERIL) 10 MG tablet Take 1 tablet (10 mg total) by mouth 3 (three) times daily as needed for muscle spasms. 06/30/20   Donita Brooks, MD  Dapsone 7.5 % GEL     [provider]  gabapentin (NEURONTIN) 300 MG capsule Take 1 capsule (300 mg total) by mouth 3 (three) times daily as needed (nerve pain). 07/15/20   Donita Brooks, MD  meloxicam (MOBIC) 15 MG tablet Take 1 tablet (15 mg total) by mouth daily. 07/15/20   Donita Brooks, MD  PRESCRIPTION MEDICATION prescription face cream from Dermatologist    [provider]    Current Outpatient Medications  Medication Sig Dispense Refill   Sulfacetamide Sodium-Sulfur (AVAR CLEANSER) 10-5 % EMUL Avar 10 %-5 % (w/w) topical cleanser  APPLY TO THE AFFECTED AREA EVERY DAY     Sulfacetamide Sodium-Sulfur (AVAR CLEANSER) 10-5 %  LIQD Avar 10 %-5 % (w/w) topical cleanser  APPLY TO THE AFFECTED AREA EVERY DAY     amoxicillin (AMOXIL) 875 MG tablet      cyclobenzaprine (FLEXERIL) 10 MG tablet Take 1 tablet (10 mg total) by mouth 3 (three) times daily as needed for muscle spasms. 30 tablet 0   Dapsone 7.5 % GEL      gabapentin (NEURONTIN) 300 MG capsule Take 1 capsule (300 mg total) by mouth 3 (three) times daily as needed (nerve pain). 90 capsule 3   meloxicam (MOBIC) 15 MG tablet Take 1 tablet (15 mg total) by mouth daily. 30 tablet 0   PRESCRIPTION MEDICATION prescription face cream from Dermatologist     Current Facility-Administered Medications  Medication Dose Route Frequency Provider Last Rate Last Admin   0.9 %  sodium chloride infusion  500 mL Intravenous Once Jenel Lucks, MD        Allergies as of 04/19/2022   (No Known Allergies)    Family History  Problem Relation Age of Onset   Healthy Mother    Colon cancer Father    Healthy Father    Healthy Brother    Uterine cancer Maternal Grandmother    Liver cancer Maternal Grandmother    Colon cancer Paternal Grandfather    Esophageal cancer Neg Hx    Pancreatic cancer Neg Hx    Stomach cancer Neg Hx  Social History   Socioeconomic History   Marital status: Married    Spouse name: Not on file   Number of children: 2   Years of education: Masters   Highest education level: Not on file  Occupational History    Employer: [employer]  Tobacco Use   Smoking status: Never   Smokeless tobacco: Never  Vaping Use   Vaping Use: Never used  Substance and Sexual Activity   Alcohol use: No   Drug use: No   Sexual activity: Not on file  Other Topics Concern   Not on file  Social History Narrative   Patient is right handed   Patient drinks one cup caffeine daily.   Social Determinants of Health   Financial Resource Strain: Not on file  Food Insecurity: Not on file  Transportation Needs: Not on file  Physical Activity: Not on  file  Stress: Not on file  Social Connections: Not on file  Intimate Partner Violence: Not on file    Review of Systems:  All other review of systems negative except as mentioned in the HPI.  Physical Exam: Vital signs BP 111/74 (BP Location: Right Arm, Patient Position: Sitting, Cuff Size: Normal)   Pulse 69   Temp 97.7 F (36.5 C) (Temporal)   Ht 5\' 4"  (1.626 m)   Wt 142 lb (64.4 kg)   LMP 04/18/2022 (Exact Date)   SpO2 99%   BMI 24.37 kg/m   General:   Alert,  Well-developed, well-nourished, pleasant and cooperative in NAD Airway:  Mallampati 2 Lungs:  Clear throughout to auscultation.   Heart:  Regular rate and rhythm; no murmurs, clicks, rubs,  or gallops. Abdomen:  Soft, nontender and nondistended. Normal bowel sounds.   Neuro/Psych:  Normal mood and affect. A and O x 3   Jordyan Hardiman E. 04/20/2022, MD [facility]

## 2022-04-19 NOTE — Op Note (Signed)
[facility] Patient Name: Cindy Miles Procedure Date: 04/19/2022 8:28 AM MRN: [id number] Endoscopist: Lorin Picket E. Tomasa Rand , MD Age: 41 Referring MD:  Date of Birth: 1980/12/14 Gender: Female Account #: [id number] Procedure:                Colonoscopy Indications:              Screening in patient at increased risk: Colorectal                            cancer in father before age 32 Medicines:                Monitored Anesthesia Care Procedure:                Pre-Anesthesia Assessment:                           - Prior to the procedure, a History and Physical                            was performed, and patient medications and                            allergies were reviewed. The patient's tolerance of                            previous anesthesia was also reviewed. The risks                            and benefits of the procedure and the sedation                            options and risks were discussed with the patient.                            All questions were answered, and informed consent                            was obtained. Prior Anticoagulants: The patient has                            taken no previous anticoagulant or antiplatelet                            agents. ASA Grade Assessment: II - A patient with                            mild systemic disease. After reviewing the risks                            and benefits, the patient was deemed in                            satisfactory condition to undergo the procedure.  After obtaining informed consent, the colonoscope                            was passed under direct vision. Throughout the                            procedure, the patient's blood pressure, pulse, and                            oxygen saturations were monitored continuously. The                            Olympus PCF-H190DL (#[id number]) Colonoscope was                            introduced through the  anus and advanced to the the                            terminal ileum, with identification of the                            appendiceal orifice and IC valve. The colonoscopy                            was performed without difficulty. The patient                            tolerated the procedure well. The quality of the                            bowel preparation was excellent. The terminal                            ileum, ileocecal valve, appendiceal orifice, and                            rectum were photographed. The bowel preparation                            used was Sutab via split dose instruction. Scope In: 8:36:21 AM Scope Out: 8:48:58 AM Scope Withdrawal Time: 0 hours 8 minutes 8 seconds  Total Procedure Duration: 0 hours 12 minutes 37 seconds  Findings:                 The perianal and digital rectal examinations were                            normal. Pertinent negatives include normal                            sphincter tone and no palpable rectal lesions.                           The colon (entire examined portion) appeared normal.  The terminal ileum appeared normal.                           The retroflexed view of the distal rectum and anal                            verge was normal and showed no anal or rectal                            abnormalities. Complications:            No immediate complications. Estimated Blood Loss:     Estimated blood loss: none. Impression:               - The entire examined colon is normal.                           - The examined portion of the ileum was normal.                           - The distal rectum and anal verge are normal on                            retroflexion view.                           - No specimens collected. Recommendation:           - Patient has a contact number available for                            emergencies. The signs and symptoms of potential                             delayed complications were discussed with the                            patient. Return to normal activities tomorrow.                            Written discharge instructions were provided to the                            patient.                           - Resume previous diet.                           - Continue present medications.                           - Repeat colonoscopy in 5 years for screening                            purposes. Zailey Audia E. Tomasa Rand, MD 04/19/2022 8:56:28 AM This report has been signed electronically.

## 2022-04-23 ENCOUNTER — Telehealth: Payer: Self-pay

## 2022-04-23 NOTE — Telephone Encounter (Signed)
  Follow up Call-     04/19/2022    7:45 AM  Call back number  Post procedure Call Back phone  # [phone]  Permission to leave phone message Yes     Patient questions:  Do you have a fever, pain , or abdominal swelling? No. Pain Score  0 *  Have you tolerated food without any problems? Yes.    Have you been able to return to your normal activities? Yes.    Do you have any questions about your discharge instructions: Diet   No. Medications  No. Follow up visit  No.  Do you have questions or concerns about your Care? No.  Actions: * If pain score is 4 or above: No action needed, pain <4.

## 2022-06-14 ENCOUNTER — Ambulatory Visit
Admission: RE | Admit: 2022-06-14 | Discharge: 2022-06-14 | Disposition: A | Discharge status: Home / Self Care | Payer: No Typology Code available for payment source | Source: Ambulatory Visit | Attending: Obstetrics and Gynecology | Admitting: Obstetrics and Gynecology

## 2022-06-14 DIAGNOSIS — Z803 Family history of malignant neoplasm of breast: Secondary | ICD-10-CM

## 2022-06-14 MED ORDER — GADOBUTROL 1 MMOL/ML IV SOLN
6.0000 mL | Freq: Once | INTRAVENOUS | Status: AC | PRN
  Administered 2022-06-14: 6 mL via INTRAVENOUS

## 2023-01-11 ENCOUNTER — Other Ambulatory Visit: Payer: Self-pay | Admitting: Neurosurgery

## 2023-01-11 DIAGNOSIS — M5440 Lumbago with sciatica, unspecified side: Secondary | ICD-10-CM

## 2023-01-12 ENCOUNTER — Ambulatory Visit (HOSPITAL_COMMUNITY): Payer: No Typology Code available for payment source

## 2023-01-12 ENCOUNTER — Encounter (HOSPITAL_COMMUNITY): Payer: Self-pay

## 2023-03-21 ENCOUNTER — Encounter: Payer: Self-pay | Admitting: Family Medicine

## 2023-03-25 ENCOUNTER — Other Ambulatory Visit: Payer: Self-pay | Admitting: Family Medicine

## 2023-03-25 MED ORDER — DIAZEPAM 5 MG PO TABS: 5.0000 mg | ORAL_TABLET | Freq: Two times a day (BID) | ORAL | 0 refills | Status: AC | PRN

## 2024-01-24 DIAGNOSIS — Z719 Counseling, unspecified: Secondary | ICD-10-CM

## 2024-05-20 ENCOUNTER — Other Ambulatory Visit: Payer: Self-pay | Admitting: Obstetrics and Gynecology

## 2024-05-20 DIAGNOSIS — Z803 Family history of malignant neoplasm of breast: Secondary | ICD-10-CM

## 2024-06-04 ENCOUNTER — Encounter: Payer: Self-pay | Admitting: Obstetrics and Gynecology

## 2024-06-08 ENCOUNTER — Encounter: Payer: Self-pay | Admitting: Obstetrics and Gynecology

## 2024-06-09 ENCOUNTER — Encounter: Payer: Self-pay | Admitting: Obstetrics and Gynecology

## 2024-06-12 ENCOUNTER — Encounter: Payer: Self-pay | Admitting: Podiatry

## 2024-06-12 ENCOUNTER — Ambulatory Visit: Admitting: Podiatry

## 2024-06-12 ENCOUNTER — Encounter: Payer: Self-pay | Admitting: Obstetrics and Gynecology

## 2024-06-12 DIAGNOSIS — L600 Ingrowing nail: Secondary | ICD-10-CM | POA: Diagnosis not present

## 2024-06-12 MED ORDER — CEPHALEXIN 500 MG PO CAPS: 500.0000 mg | ORAL_CAPSULE | Freq: Two times a day (BID) | ORAL | 0 refills | Status: AC

## 2024-06-12 NOTE — Progress Notes (Signed)
 Patient complains of painful ingrown medial nail border border(s) hallux left.  Has had problems with it for many years gets ingrown if she takes the corner out and begins hurting later on.  Patient denies fevers, chills, nausea, vomiting.  Objective:  Vitals: Reviewed  General: Well developed, nourished, in no acute distress, alert and oriented x3   Vascular: DP pulse 2/4 bilateral. PT pulse 2/4 bilateral.  Mild to moderate edema hallux left  Dermatology: Erythema, edema, incurvated nail border medial border hallux left with serous drainage . Tenderness present with palpation. Normal skin tone and texture feet with normal hair growth.  Neurological: Grossly intact. Normal reflexes.   Musculoskeletal: Tenderness with palpation of the distal hallux left. No tenderness or painful ROM at IPJ.  Diagnosis: Ingrown nail medial border hallux nail left  Plan: -discussed etiology and treatment of ingrown nails. Discussed surgical vs conservative treatment. -Consent signed for appropriate matrixectomy affected nail(s). -Rx: Keflex  500 mg, 2 p.o. twice daily for 7 days  Procedure(s):   - Matrixectomy(s) medial border hallux left: Toe anesthetized with 3cc 2:1 mixture 2% Lidocaine with epinephrine: Sodium Bicarbonate. Surgical site prepped. Digital tourniquet applied.  Avulsion of nail border. performed. Matrixecomy performed with three 30 second applications of phenol to nail matrix. Site irrigated with alcohol.  Tourniquet released with good vascularity noticed in digit.  Applied triple antibiotic to nailbed and applied gauze and Coban dressing. - Written and oral postoperative instructions given.  -Return for post-op 2 weeks.  JINNY Prentice Binder, DPM

## 2024-06-12 NOTE — Patient Instructions (Signed)

## 2024-06-16 ENCOUNTER — Ambulatory Visit
Admission: RE | Admit: 2024-06-16 | Discharge: 2024-06-16 | Disposition: A | Discharge status: Home / Self Care | Source: Ambulatory Visit | Attending: Obstetrics and Gynecology | Admitting: Obstetrics and Gynecology

## 2024-06-16 DIAGNOSIS — Z803 Family history of malignant neoplasm of breast: Secondary | ICD-10-CM

## 2024-06-16 MED ORDER — GADOPICLENOL 0.5 MMOL/ML IV SOLN
6.0000 mL | Freq: Once | INTRAVENOUS | Status: AC | PRN
  Administered 2024-06-16: 6 mL via INTRAVENOUS

## 2024-07-02 ENCOUNTER — Ambulatory Visit: Admitting: Podiatry
